# Patient Record
Sex: Female | Born: 1935 | Race: White | Hispanic: No | Smoking: Never smoker
Health system: Southern US, Community
[De-identification: ages and names within clinical notes are randomized; demographics above are authoritative.]

## PROBLEM LIST (undated history)

## (undated) DIAGNOSIS — I10 Essential (primary) hypertension: Secondary | ICD-10-CM

## (undated) DIAGNOSIS — M1990 Unspecified osteoarthritis, unspecified site: Secondary | ICD-10-CM

## (undated) HISTORY — PX: KNEE SURGERY: SHX244

## (undated) HISTORY — PX: APPENDECTOMY: SHX54

---

## 2005-12-23 ENCOUNTER — Emergency Department (HOSPITAL_COMMUNITY): Admission: EM | Admit: 2005-12-23 | Discharge: 2005-12-23 | Payer: Self-pay | Admitting: Emergency Medicine

## 2005-12-24 ENCOUNTER — Ambulatory Visit: Payer: Self-pay | Admitting: Orthopedic Surgery

## 2005-12-25 ENCOUNTER — Ambulatory Visit: Payer: Self-pay | Admitting: Orthopedic Surgery

## 2005-12-25 ENCOUNTER — Inpatient Hospital Stay (HOSPITAL_COMMUNITY): Admission: RE | Admit: 2005-12-25 | Discharge: 2005-12-29 | Payer: Self-pay | Admitting: Orthopedic Surgery

## 2005-12-29 ENCOUNTER — Inpatient Hospital Stay: Admission: AD | Admit: 2005-12-29 | Discharge: 2006-05-18 | Payer: Self-pay | Admitting: Family Medicine

## 2006-01-07 ENCOUNTER — Ambulatory Visit: Payer: Self-pay | Admitting: Orthopedic Surgery

## 2006-02-04 ENCOUNTER — Ambulatory Visit: Payer: Self-pay | Admitting: Orthopedic Surgery

## 2006-03-25 ENCOUNTER — Ambulatory Visit: Payer: Self-pay | Admitting: Orthopedic Surgery

## 2009-05-05 ENCOUNTER — Emergency Department (HOSPITAL_COMMUNITY): Admission: EM | Admit: 2009-05-05 | Discharge: 2009-05-05 | Payer: Self-pay | Admitting: Emergency Medicine

## 2010-06-04 ENCOUNTER — Inpatient Hospital Stay (HOSPITAL_COMMUNITY): Admission: EM | Admit: 2010-06-04 | Discharge: 2010-06-04 | Payer: Self-pay | Admitting: Emergency Medicine

## 2010-11-13 LAB — DIFFERENTIAL
Basophils Absolute: 0 10*3/uL (ref 0.0–0.1)
Basophils Relative: 0 % (ref 0–1)
Eosinophils Absolute: 0 10*3/uL (ref 0.0–0.7)
Eosinophils Relative: 0 % (ref 0–5)
Lymphocytes Relative: 9 % — ABNORMAL LOW (ref 12–46)
Lymphs Abs: 0.8 10*3/uL (ref 0.7–4.0)
Monocytes Absolute: 0.3 10*3/uL (ref 0.1–1.0)
Monocytes Relative: 4 % (ref 3–12)
Neutro Abs: 7.4 10*3/uL (ref 1.7–7.7)
Neutrophils Relative %: 87 % — ABNORMAL HIGH (ref 43–77)

## 2010-11-13 LAB — CBC
HCT: 36.3 % (ref 36.0–46.0)
Hemoglobin: 12.3 g/dL (ref 12.0–15.0)
MCH: 30.5 pg (ref 26.0–34.0)
MCHC: 34 g/dL (ref 30.0–36.0)
MCV: 89.7 fL (ref 78.0–100.0)
Platelets: 157 10*3/uL (ref 150–400)
RBC: 4.05 MIL/uL (ref 3.87–5.11)
RDW: 14.1 % (ref 11.5–15.5)
WBC: 8.6 10*3/uL (ref 4.0–10.5)

## 2010-11-13 LAB — BASIC METABOLIC PANEL
BUN: 17 mg/dL (ref 6–23)
CO2: 28 mEq/L (ref 19–32)
Calcium: 8.9 mg/dL (ref 8.4–10.5)
Chloride: 105 mEq/L (ref 96–112)
Creatinine, Ser: 1.12 mg/dL (ref 0.4–1.2)
GFR calc Af Amer: 55 mL/min — ABNORMAL LOW (ref >60)
GFR calc non Af Amer: 45 mL/min — ABNORMAL LOW (ref >60)
Glucose, Bld: 173 mg/dL — ABNORMAL HIGH (ref 70–99)
Potassium: 3.8 mEq/L (ref 3.5–5.1)
Sodium: 140 mEq/L (ref 135–145)

## 2010-11-13 LAB — POCT CARDIAC MARKERS
CKMB, poc: 1.4 ng/mL (ref 1.0–8.0)
Myoglobin, poc: 278 ng/mL (ref 12–200)
Troponin i, poc: <0.05 ng/mL (ref 0.00–0.09)

## 2010-12-05 LAB — BASIC METABOLIC PANEL
BUN: 12 mg/dL (ref 6–23)
CO2: 27 mEq/L (ref 19–32)
Calcium: 9.4 mg/dL (ref 8.4–10.5)
Chloride: 101 mEq/L (ref 96–112)
Creatinine, Ser: 0.96 mg/dL (ref 0.4–1.2)
GFR calc Af Amer: >60 mL/min (ref >60)
GFR calc non Af Amer: 54 mL/min — ABNORMAL LOW (ref >60)
Glucose, Bld: 110 mg/dL — ABNORMAL HIGH (ref 70–99)
Potassium: 3.8 mEq/L (ref 3.5–5.1)
Sodium: 138 mEq/L (ref 135–145)

## 2010-12-05 LAB — DIFFERENTIAL
Basophils Absolute: 0 10*3/uL (ref 0.0–0.1)
Basophils Relative: 0 % (ref 0–1)
Eosinophils Absolute: 0 10*3/uL (ref 0.0–0.7)
Eosinophils Relative: 0 % (ref 0–5)
Lymphocytes Relative: 15 % (ref 12–46)
Lymphs Abs: 0.7 10*3/uL (ref 0.7–4.0)
Monocytes Absolute: 0.3 10*3/uL (ref 0.1–1.0)
Monocytes Relative: 6 % (ref 3–12)
Neutro Abs: 4.1 10*3/uL (ref 1.7–7.7)
Neutrophils Relative %: 79 % — ABNORMAL HIGH (ref 43–77)

## 2010-12-05 LAB — CBC
HCT: 39.4 % (ref 36.0–46.0)
Hemoglobin: 13.4 g/dL (ref 12.0–15.0)
MCHC: 34.1 g/dL (ref 30.0–36.0)
MCV: 89.8 fL (ref 78.0–100.0)
Platelets: 147 10*3/uL — ABNORMAL LOW (ref 150–400)
RBC: 4.39 MIL/uL (ref 3.87–5.11)
RDW: 13.3 % (ref 11.5–15.5)
WBC: 5.1 10*3/uL (ref 4.0–10.5)

## 2010-12-05 LAB — POCT CARDIAC MARKERS
CKMB, poc: <1 ng/mL — ABNORMAL LOW (ref 1.0–8.0)
Myoglobin, poc: 112 ng/mL (ref 12–200)
Troponin i, poc: <0.05 ng/mL (ref 0.00–0.09)

## 2011-01-16 NOTE — Group Therapy Note (Signed)
[name], [name]                  ACCOUNT NO.:  [id number]   MEDICAL RECORD NO.:  [id number]          PATIENT TYPE:  ORB   LOCATION:  S109                          FACILITY:  [facility]   PHYSICIAN:  Angus G. Renard Matter, MD   DATE OF BIRTH:  22-Aug-1913   DATE OF PROCEDURE:  DATE OF DISCHARGE:                                   PROGRESS NOTE   This patient was admitted to the nursing center in May 2007.  She had an  open internal fixation of her left tibia plateau and bone graft.  She is  being seen by Dr. Fuller Canada. Her condition remains stable.  She is  semi-ambulatory.   OBJECTIVE:  VITAL SIGNS:  Blood pressure 157/94, respirations 20,  temperature 98.  LUNGS:  Clear to P&A.  HEART:  Regular rhythm.  ABDOMEN:  No palpable organs or masses.   IMPRESSION:  Patient is continuing to improve following surgery.   PLAN:  To continue current regimen.  Continue to participate in PT and OT.  Medication list reviewed, continue medications.      Angus G. Renard Matter, MD  Electronically Signed     AGM/MEDQ  D:  04/11/2006  T:  04/12/2006  Job:  [id number]

## 2011-01-16 NOTE — Op Note (Signed)
[name], [name]                  ACCOUNT NO.:  [id number]   MEDICAL RECORD NO.:  [id number]          PATIENT TYPE:  INP   LOCATION:  A312                          FACILITY:  [facility]   PHYSICIAN:  Vickki Hearing, M.D.DATE OF BIRTH:  1912/10/03   DATE OF PROCEDURE:  12/25/2005  DATE OF DISCHARGE:                                 OPERATIVE REPORT   BRIEF HISTORY:  This is a 77 year old female.  She fell coming out of the  house, landed on her left knee on April 25, was seen in the ER that day,  placed in an immobilizer and sent to the office April 26.  She had  radiographs taken in the office which confirmed a lateral split and  depressed left tibial plateau fracture and she was scheduled for surgery for  today.   PREOPERATIVE DIAGNOSIS:  Closed left tibial plateau fracture (lateral split  depression).   POSTOP DIAGNOSIS:  Closed left tibial plateau fracture (lateral split  depression).   PROCEDURE:  Open treatment internal fixation with allograft bone chips.   TOURNIQUET TIME:  Hour and 30 minutes.   We used spinal anesthetic, had minimal blood loss, used a Synthes  periarticular plate with locking screws.   SURGEON:  Romeo Apple assisted by Forde Dandy.   OPERATIVE FINDINGS:  Lateral split depression with intra-articular extension  and displacement.   The procedure was done as follows.  Ms. Cummings was identified in preop  holding area as [place].  She marked her left knee as the surgical  site.  This was countersigned by the surgeon and then we updated her history  and physical.  She was given a gram of Ancef, taken to the operating room  for a spinal anesthetic.  She was placed supine on the operating table and  her leg was prepped and draped with DuraPrep in sterile technique.  The time-  out was completed.  The procedure was confirmed as a left tibial plateau  fracture, open treatment internal fixation.  We confirmed antibiotics given  within an hour and that the  patient was [place] H[place].   We elevated the tourniquet to 300 mmHg after exsanguination of the limb with  a 4-inch Esmarch.  We then made a curvilinear incision over the lateral  plateau, extended it down to the fascia in one continuous split, divided the  fascia in line with the skin incision and then opened the capsule and lifted  the lateral meniscus superiorly and identified the fracture.  We hinged open  the fracture site, elevated the fracture and placed bone graft, closed the  fracture back down, held it with K-wires and applied a lateral buttress type  plate using a standard technique of drilling, depth gauge and insertion of  screws.  I took radiographs to confirm the position of our reduction  throughout the procedure and found it to be satisfactory.  We irrigated the  wound, closed in layered fashion with 0 Monocryl, 2-0 Monocryl and staples.  We injected 30 mL Sensorcaine.  At the end of the procedure we placed a  sterile  dressing and Ace bandage, a cryo cuff and a hinged knee brace.   POSTOP PLAN:  The patient will be nonweightbearing until radiographs suggest  weightbearing can be started.  We suspect this will take 3 months.  She will  be in a CPM machine advanced up to 0 to 90.  She will stay in a brace for 3  months.      Vickki Hearing, M.D.  Electronically Signed     SEH/MEDQ  D:  12/25/2005  T:  12/26/2005  Job:  [id number]

## 2011-01-16 NOTE — H&P (Signed)
[name], [name]                  ACCOUNT NO.:  [id number]   MEDICAL RECORD NO.:  [id number]          PATIENT TYPE:  AMB   LOCATION:  DAY                           FACILITY:  [facility]   PHYSICIAN:  Vickki Hearing, M.D.DATE OF BIRTH:  December 25, 1912   DATE OF ADMISSION:  12/24/2005  DATE OF DISCHARGE:  LH                                HISTORY & PHYSICAL   CHIEF COMPLAINT:  Surgery, left knee, scheduled for December 25, 2005.   HISTORY OF PRESENT ILLNESS:  This is a 77 year old female who fell on December 23, 2005 and fractured her left tibial plateau.  She was seen in the office  on December 24, 2005.  A 10-degree caudal tilt view of the knee was taken which  showed the displaced fracture not seen on x-rays at the hospital.  She  complains of mild pain at this time.  She denies any numbness or tingling.  She does have swelling   This patient is remarkably healthy.   REVIEW OF SYSTEMS:  She has a negative review of systems.   ALLERGIES:  She has no allergies.   PAST MEDICAL HISTORY:  She does have hypertension.   PAST SURGICAL HISTORY:  She is status post an appendectomy.   MEDICATIONS:  She takes Norvasc 5 mg a day.   FAMILY HISTORY:  Heart disease and diabetes.   FAMILY PHYSICIAN:  Dr. Renard Matter.   SOCIAL HISTORY:  She is widowed.  she has no social habits and has her high  school diploma.   PHYSICAL EXAMINATION:  VITAL SIGNS:  Her weight is 98 pounds.  Her pulse is  86 and her respiratory rate is 18.  HEENT:  Normal head, eyes, ears, nose and throat.  NECK:  Supple neck with no lymphadenopathy.  CHEST:  Clear.  HEART:  Rate and rhythm normal.  ABDOMEN:  Soft and nondistended.  No tenderness.  EXTREMITIES:  Her injured extremity is swollen.  She has painful range of  motion.  She has leg length and alignments, which are intact.  The ligaments  of the knee could not be tested due to pain.  Muscle strength continues to  be normal.   ADMITTING DIAGNOSIS:  Closed tibial plateau  fracture of the left lower  extremity.   PLAN:  The plan is for open treatment/internal fixation with plate and screw  construct plus-or-minus bone graft.      Vickki Hearing, M.D.  Electronically Signed     SEH/MEDQ  D:  12/24/2005  T:  12/25/2005  Job:  [id number]   cc:   Angus G. Renard Matter, MD  Fax: [phone]

## 2011-01-16 NOTE — Group Therapy Note (Signed)
[name], [name]                  ACCOUNT NO.:  [id number]   MEDICAL RECORD NO.:  [id number]          PATIENT TYPE:  ORB   LOCATION:  S109                          FACILITY:  [facility]   PHYSICIAN:  Angus G. Renard Matter, MD   DATE OF BIRTH:  1913-04-10   DATE OF PROCEDURE:  12/31/2005  DATE OF DISCHARGE:                                   PROGRESS NOTE   This patient was admitted to the nursing center on Dec 29, 2005, after having  been treated by Vickki Hearing, M.D. and apparently the patient had  open internal fixation of the left tibial plateau with bone graft.  She  remains on Lovenox.   PHYSICAL EXAMINATION:  VITAL SIGNS:  Blood pressure 104/69, respirations 18,  pulse 97.  LUNGS:  Clear.  HEART:  Regular rhythm.  ABDOMEN:  No palpable organs or masses.  The patient has some tenderness  over the incision of the left knee.   ASSESSMENT:  The patient has a history of fracture of tibial plateau, left  knee.  She had surgery on December 25, 2005.  Plan to continue current orders.  The patient is to have nonweightbearing for 12 weeks.      Angus G. Renard Matter, MD  Electronically Signed     AGM/MEDQ  D:  12/31/2005  T:  01/01/2006  Job:  [id number]

## 2011-01-16 NOTE — Discharge Summary (Signed)
[name], [name]                  ACCOUNT NO.:  [id number]   MEDICAL RECORD NO.:  [id number]          PATIENT TYPE:  INP   LOCATION:  A312                          FACILITY:  [facility]   PHYSICIAN:  Vickki Hearing, M.D.DATE OF BIRTH:  [birth year]   DATE OF ADMISSION:  12/25/2005  DATE OF DISCHARGE:  05/01/2007LH                                 DISCHARGE SUMMARY   ADMITTING DIAGNOSIS:  Closed split depressed left tibial plateau fracture.   POSTOPERATIVE DIAGNOSIS:  Closed split depressed left tibial plateau  fracture.   OPERATIVE PROCEDURE:  Open treatment internal fixation, left tibial plateau  with bone graft.   HISTORY:  This is a 77 year old female who fell April 25, fractured her left  tibial plateau.  Radiographs in my office showed displacement of a fracture,  not seen as displaced at the hospital.   Her hospital course was remarkable for developing some nausea and vomiting  on postoperative day 2 resolved with Phenergan.   LAB STUDIES:  Hemoglobin 10.5 on discharge on April 29.  Potassium 3.8.  Her  urine was clear on the 26th.   MEDICATIONS:  1.  Tylenol 1000 mg every 8 hours for pain.  2.  Norvasc 5 mg daily.  3.  Lovenox 30 mg daily.  4.  K-Dur 20 mEq daily.  5.  Phenergan 25 mg p.o. q.6 p.r.n. nausea.   PHYSICAL THERAPY INSTRUCTIONS:  The patient is nonweightbearing for a total  of 12 weeks.   She is to have passive range of motion to tolerance and strengthening  exercises for the left lower extremity.  Gait training.   WOUND INSTRUCTIONS:  Cherlynn Polo are to be removed on May 8.   FOLLOW-UP VISIT:  May 9.  Call [phone] for appointment.   DISPOSITION:  To [facility].   CONDITION:  Overall improved.      Vickki Hearing, M.D.  Electronically Signed     SEH/MEDQ  D:  12/29/2005  T:  12/29/2005  Job:  [id number]

## 2011-01-16 NOTE — Group Therapy Note (Signed)
[name], [name]                  ACCOUNT NO.:  [id number]   MEDICAL RECORD NO.:  [id number]          PATIENT TYPE:  INP   LOCATION:  A312                          FACILITY:  [facility]   PHYSICIAN:  Vickki Hearing, M.D.DATE OF BIRTH:  04-17-1913   DATE OF PROCEDURE:  12/26/2005  DATE OF DISCHARGE:                                   PROGRESS NOTE   She is 1 day status post open treatment and internal fixation with bone  graft of her left tibial plateau fracture.  Vital signs are stable.  Pain  level is between 4 and 6 on Vicodin and morphine p.r.n. every 2 hours.  No  signs of compartment syndrome.  Neurovascular status to the limb is normal.  I did add some potassium today for potassium at 3.6 to 3.5 range.  We will  do that once a day until she is discharged.  She will start bed-to-chair  mobilization, nonweightbearing today.      Vickki Hearing, M.D.  Electronically Signed     SEH/MEDQ  D:  12/26/2005  T:  12/27/2005  Job:  [id number]

## 2011-01-16 NOTE — Group Therapy Note (Signed)
[name], [name]                  ACCOUNT NO.:  [id number]   MEDICAL RECORD NO.:  [id number]          PATIENT TYPE:  ORB   LOCATION:  S109                          FACILITY:  [facility]   PHYSICIAN:  Angus G. Renard Matter, MD   DATE OF BIRTH:  03-07-1913   DATE OF PROCEDURE:  DATE OF DISCHARGE:                                   PROGRESS NOTE   This patient was originally admitted to the Nursing Center in May, 2007.  She did have an open internal fixation of the left tibial plateau and bone  graft.  She is being treated by Dr. Fuller Canada.  Her condition remains  stable.   OBJECTIVE:  VITAL SIGNS:  Blood pressure 123/70, respirations 18, pulse 84,  temperature 97.  LUNGS:  Clear.  HEART:  Regular rhythm.   ASSESSMENT:  The patient has a history of fractured tibial plateau, left  knee surgery in April of 2007.   PLAN:  1.  To continue current regimen.  2.  Medications reviewed.  Continue current regimen.      Angus G. Renard Matter, MD  Electronically Signed     AGM/MEDQ  D:  03/07/2006  T:  03/07/2006  Job:  [id number]

## 2011-01-16 NOTE — Group Therapy Note (Signed)
[name], [name]                  ACCOUNT NO.:  [id number]   MEDICAL RECORD NO.:  [id number]          PATIENT TYPE:  ORB   LOCATION:  S109                          FACILITY:  [facility]   PHYSICIAN:  Angus G. Renard Matter, MD   DATE OF BIRTH:  December 05, 1912   DATE OF PROCEDURE:  DATE OF DISCHARGE:                                   PROGRESS NOTE   This patient was originally admitted to the nursing center in May, 2007.  At  that time, being treated by Dr. Fuller Canada. She had an opened internal  fixation of the left tibia plateau with bone graft.  Her condition remains  stable.   PHYSICAL EXAMINATION:  GENERAL:  She was alert and oriented.  VITAL SIGNS:  Her blood pressure 100/63, respirations 18, pulse 10,  temperature 97.7.  LUNGS:  Clear.  HEART:  Regular rhythm.  ABDOMEN:  No palpable organs or masses.   ASSESSMENT:  The patient has a history of fractured tibial plateau, left  knee with surgery December 25, 2005.   PLAN:  Continue current regimen.   MEDICATIONS REVIEWED:  Continue current medicines.   She continues to have an air cast to left leg and is nonweightbearing for a  total of 12 weeks.      Angus G. Renard Matter, MD  Electronically Signed     AGM/MEDQ  D:  02/15/2006  T:  02/16/2006  Job:  [id number]

## 2011-01-16 NOTE — Procedures (Signed)
[name], [name]                  ACCOUNT NO.:  [id number]   MEDICAL RECORD NO.:  [id number]          PATIENT TYPE:  INP   LOCATION:  A312                          FACILITY:  [facility]   PHYSICIAN:  Edward L. Juanetta Gosling, M.D.DATE OF BIRTH:  22-Apr-1913   DATE OF PROCEDURE:  DATE OF DISCHARGE:                                EKG INTERPRETATION   Rhythm is sinus rhythm with a rate in the 90s.  There is right atrial  enlargement and probably left atrial enlargement as well.  There is left  ventricular hypertrophy.  There are Q-waves in the initial precordial leads  which could be due to a previous infarction or could be related to left  ventricular hypertrophy.  Abnormal electrocardiogram.      Oneal Deputy. Juanetta Gosling, M.D.  Electronically Signed     ELH/MEDQ  D:  12/25/2005  T:  12/25/2005  Job:  [id number]

## 2011-01-16 NOTE — Group Therapy Note (Signed)
NAMEANGELICIA, LESSNER                  ACCOUNT NO.:  [id number]   MEDICAL RECORD NO.:  [id number]          PATIENT TYPE:  ORB   LOCATION:  S109                          FACILITY:  [facility]   PHYSICIAN:  Angus G. Renard Matter, MD   DATE OF BIRTH:  [birth year]   DATE OF PROCEDURE:  DATE OF DISCHARGE:                                   PROGRESS NOTE   This patient has been in the nursing center since May, 2007.  She had open  internal fixation of her left tibial plateau and bone graft.  She is being  followed as well by Dr. Fuller Canada.  Her condition remains stable.  She is semi ambulatory now.   OBJECTIVE:  VITAL SIGNS:  Blood pressure 140/78, respirations 20, pulse 83,  temp 98.  LUNGS:  Clear to P&A.  HEART:  Regular rhythm.  ABDOMEN:  No palpable organs or masses.   IMPRESSION:  Patient continues to improve following surgery, as stated  above.   PLAN:  Continue current regimen.  Continue to participate in PT/OT.  Medication list reviewed.  Continue current medications.      Angus G. Renard Matter, MD  Electronically Signed     AGM/MEDQ  D:  05/13/2006  T:  05/14/2006  Job:  [id number]

## 2011-11-30 ENCOUNTER — Other Ambulatory Visit: Payer: Self-pay

## 2011-11-30 ENCOUNTER — Emergency Department (HOSPITAL_COMMUNITY): Payer: Medicare PPO

## 2011-11-30 ENCOUNTER — Inpatient Hospital Stay (HOSPITAL_COMMUNITY)
Admission: EM | Admit: 2011-11-30 | Discharge: 2011-12-04 | DRG: 923 | Disposition: A | Discharge status: Skilled Nursing Facility | Payer: Medicare PPO | Attending: Family Medicine | Admitting: Family Medicine

## 2011-11-30 ENCOUNTER — Encounter (HOSPITAL_COMMUNITY): Payer: Self-pay | Admitting: *Deleted

## 2011-11-30 DIAGNOSIS — M1990 Unspecified osteoarthritis, unspecified site: Secondary | ICD-10-CM | POA: Diagnosis present

## 2011-11-30 DIAGNOSIS — I10 Essential (primary) hypertension: Secondary | ICD-10-CM | POA: Diagnosis present

## 2011-11-30 DIAGNOSIS — F29 Unspecified psychosis not due to a substance or known physiological condition: Secondary | ICD-10-CM | POA: Diagnosis present

## 2011-11-30 DIAGNOSIS — R4182 Altered mental status, unspecified: Secondary | ICD-10-CM

## 2011-11-30 DIAGNOSIS — W19XXXA Unspecified fall, initial encounter: Secondary | ICD-10-CM

## 2011-11-30 DIAGNOSIS — Z9181 History of falling: Secondary | ICD-10-CM

## 2011-11-30 DIAGNOSIS — T68XXXA Hypothermia, initial encounter: Principal | ICD-10-CM | POA: Diagnosis present

## 2011-11-30 DIAGNOSIS — E860 Dehydration: Secondary | ICD-10-CM | POA: Diagnosis present

## 2011-11-30 DIAGNOSIS — I2510 Atherosclerotic heart disease of native coronary artery without angina pectoris: Secondary | ICD-10-CM | POA: Diagnosis present

## 2011-11-30 DIAGNOSIS — IMO0002 Reserved for concepts with insufficient information to code with codable children: Secondary | ICD-10-CM | POA: Diagnosis present

## 2011-11-30 DIAGNOSIS — X31XXXA Exposure to excessive natural cold, initial encounter: Secondary | ICD-10-CM | POA: Diagnosis present

## 2011-11-30 DIAGNOSIS — Y92009 Unspecified place in unspecified non-institutional (private) residence as the place of occurrence of the external cause: Secondary | ICD-10-CM

## 2011-11-30 DIAGNOSIS — I6789 Other cerebrovascular disease: Secondary | ICD-10-CM | POA: Diagnosis present

## 2011-11-30 HISTORY — DX: Unspecified osteoarthritis, unspecified site: M19.90

## 2011-11-30 HISTORY — DX: Essential (primary) hypertension: I10

## 2011-11-30 LAB — COMPREHENSIVE METABOLIC PANEL
ALT: 14 U/L (ref 0–35)
AST: 26 U/L (ref 0–37)
Albumin: 2.5 g/dL — ABNORMAL LOW (ref 3.5–5.2)
BUN: 29 mg/dL — ABNORMAL HIGH (ref 6–23)
CO2: 28 mEq/L (ref 19–32)
Calcium: 9.2 mg/dL (ref 8.4–10.5)
Chloride: 103 mEq/L (ref 96–112)
Creatinine, Ser: 1.1 mg/dL (ref 0.50–1.10)
GFR calc Af Amer: 48 mL/min — ABNORMAL LOW (ref >90)
GFR calc non Af Amer: 41 mL/min — ABNORMAL LOW (ref >90)
Glucose, Bld: 88 mg/dL (ref 70–99)
Sodium: 139 mEq/L (ref 135–145)
Total Bilirubin: 0.2 mg/dL — ABNORMAL LOW (ref 0.3–1.2)

## 2011-11-30 LAB — URINALYSIS, ROUTINE W REFLEX MICROSCOPIC
Bilirubin Urine: NEGATIVE
Glucose, UA: NEGATIVE mg/dL
Hgb urine dipstick: NEGATIVE
Leukocytes, UA: NEGATIVE
Nitrite: NEGATIVE
Protein, ur: NEGATIVE mg/dL
Specific Gravity, Urine: >1.03 — ABNORMAL HIGH (ref 1.005–1.030)
Urobilinogen, UA: 0.2 mg/dL (ref 0.0–1.0)

## 2011-11-30 LAB — DIFFERENTIAL
Basophils Absolute: 0 10*3/uL (ref 0.0–0.1)
Basophils Relative: 0 % (ref 0–1)
Eosinophils Absolute: 0 10*3/uL (ref 0.0–0.7)
Lymphocytes Relative: 14 % (ref 12–46)
Lymphs Abs: 0.6 10*3/uL — ABNORMAL LOW (ref 0.7–4.0)
Neutro Abs: 3.4 10*3/uL (ref 1.7–7.7)

## 2011-11-30 LAB — CBC
HCT: 35.4 % — ABNORMAL LOW (ref 36.0–46.0)
MCH: 28.5 pg (ref 26.0–34.0)
MCHC: 32.5 g/dL (ref 30.0–36.0)
MCV: 87.8 fL (ref 78.0–100.0)
Platelets: 127 10*3/uL — ABNORMAL LOW (ref 150–400)
RDW: 14.4 % (ref 11.5–15.5)
WBC: 4.3 10*3/uL (ref 4.0–10.5)

## 2011-11-30 LAB — CK TOTAL AND CKMB (NOT AT ARMC)
CK, MB: 7.2 ng/mL (ref 0.3–4.0)
Relative Index: 4 — ABNORMAL HIGH (ref 0.0–2.5)

## 2011-11-30 MED ORDER — SODIUM CHLORIDE 0.9 % IV BOLUS (SEPSIS)
500.0000 mL | Freq: Once | INTRAVENOUS | Status: AC
  Administered 2011-11-30: 1000 mL via INTRAVENOUS

## 2011-11-30 MED ORDER — SODIUM CHLORIDE 0.9 % IV SOLN
INTRAVENOUS | Status: DC
  Administered 2011-12-01 (×2): via INTRAVENOUS
  Administered 2011-12-02: 1000 mL via INTRAVENOUS
  Administered 2011-12-02 – 2011-12-03 (×3): via INTRAVENOUS

## 2011-11-30 MED ORDER — SODIUM CHLORIDE 0.9 % IV SOLN: INTRAVENOUS | Status: DC

## 2011-11-30 NOTE — ED Notes (Signed)
Lab called with critical ckmb level of 7.2, Dr. Ignacia Palma notified,

## 2011-11-30 NOTE — ED Provider Notes (Signed)
History     CSN: [id number]  Arrival date & time 11/30/11  1304   First MD Initiated Contact with Patient 11/30/11 1332      Chief Complaint  Patient presents with  . Fall  . Altered Mental Status    (Consider location/radiation/quality/duration/timing/severity/associated sxs/prior treatment) HPI Comments: The patient is a 78 year old woman who lives at home. She has caregivers that come in to help her. This morning her caregiver came in and found her on the floor in the kitchen. It's not clear how long she has been lying on the floor. She is confused. She was therefore brought to [facility] ED for evaluation.  The patient has altered mental status, and is unable to provide any history or review of systems.  Patient is a 78 y.o. female presenting with fall and altered mental status. The history is provided by a relative and medical records. No language interpreter was used.  Fall Incident onset: an unknown period ago. Incident: at home. Distance fallen: apparently fell from a standing position. Pain scale: not in pain at present. She was not ambulatory at the scene. There was no entrapment after the fall. There was no drug use involved in the accident. There was no alcohol use involved in the accident. Associated symptoms comments: Altered mental status.. Treatments tried: patient was transported to [facility] ED by EMS. The treatment provided no relief.  Altered Mental Status    Past Medical History  Diagnosis Date  . Hypertension   . Arthritis     Past Surgical History  Procedure Date  . Knee surgery     left knee    History reviewed. No pertinent family history.  History  Substance Use Topics  . Smoking status: Unknown If Ever Smoked  . Smokeless tobacco: Not on file  . Alcohol Use: No    OB History    Grav Para Term Preterm Abortions TAB SAB Ect Mult Living                  Review of Systems  Unable to perform ROS: Mental status change    Psychiatric/Behavioral: Positive for altered mental status.    Allergies  Aspirin  Home Medications  No current outpatient prescriptions on file.  BP 141/68  Pulse 66  Temp(Src) 92.2 F (33.4 C) (Rectal)  Resp 20  SpO2 100%  Physical Exam  Nursing note and vitals reviewed. Constitutional:       Patient's temperature was approximately 92. She is a frail elderly woman who is able answer some questions, but does not know what happened to her and is oriented to person only. She does not recognize her daughter-in-law.  HENT:  Head: Normocephalic and atraumatic.  Right Ear: External ear normal.  Left Ear: External ear normal.  Mouth/Throat: Oropharynx is clear and moist.  Eyes: Conjunctivae and EOM are normal. Pupils are equal, round, and reactive to light.  Neck: Normal range of motion. Neck supple.  Cardiovascular: Normal rate, regular rhythm and normal heart sounds.   Pulmonary/Chest: Effort normal and breath sounds normal.  Abdominal: Soft. Bowel sounds are normal.  Musculoskeletal:       Scar on left knee from prior surgery for a tibial plateau fracture.  Neurological:       Patient is awake, oriented to person only. There is no sensory or motor deficit.  Skin: Skin is warm and dry.       Her skin turgor is poor.  Psychiatric:  She is confused, and seems demented.    ED Course  Procedures (including critical care time)   1:48 PM Patient was seen and had physical examination. Old charts were reviewed. Lab tests and x-rays for per ordered. I advised the patient's daughter-in-law that the patient would likely need admission.   3:04 PM  Date: 11/30/2011  Rate:66  Rhythm: normal sinus rhythm  QRS Axis: left  Intervals: PR prolonged QRS:  Poor R wave progression in precordial leads suggests old anterior myocardial infarction.  ST/T Wave abnormalities: normal  Conduction Disutrbances:none  Narrative Interpretation: Abnormal EKG.  Old EKG Reviewed:  unchanged  3:49 PM Lab tests did not show any major abnormality.  She is receiving IV fluids and is being rewarmed.  Call To Dr. Renard Matter or his coverage to admit her.  1. Fall   2. Altered mental status           Carleene Cooper III, MD 11/30/11 [phone]

## 2011-11-30 NOTE — ED Notes (Signed)
MD at bedside. 

## 2011-11-30 NOTE — H&P (Signed)
NAMEALIEA, BOBE                  ACCOUNT NO.:  [id number]  MEDICAL RECORD NO.:  [id number]  LOCATION:  A313                          FACILITY:  [facility]  PHYSICIAN:  Mila Homer. Sudie Bailey, M.D.DATE OF BIRTH:  [birth year]  DATE OF ADMISSION:  11/30/2011 DATE OF DISCHARGE:  LH                             HISTORY & PHYSICAL   This 78 year old was found on the floor of her home in the kitchen this morning around 9:00 am.  She had been last seen at around 1:00 p.m. 2 days ago.  She was confused.  She was brought to the [facility] emergency room for evaluation.  She is living by herself.  Her son and daughter-in-law gave me some of the history.  She has generally been pretty healthy, but required some surgery on the left knee about 6 years ago after she fell off a porch. She has had chronic swelling in the left leg since then.  She has been on amlodipine for blood pressure.  The last she saw her LMD, Dr. Renard Matter, was about a year and a half ago.  She also has a history of emphysema and arthritis as well as hypertension.  According to her daughter-in-law, she was walking with difficulty and sometimes with a walker at home before all this happened.  She has had falls in the past.  At present, she is complaining of no pain.  She is talking about events which her daughter-in-law said happened 30 years ago.  Her speech appears to be normal although she is confused.  On admission to the emergency room, her blood pressure is 141/68, the pulse is 66 and temperature 92.2 Fahrenheit rectally and her respiratory rate is 20.  O2 saturation was 100%.  At the time I saw her, her temperature had warmed up to about 95 degrees on a warming blanket.  She was supine in bed.  Her son and daughter-in-law were in attendance. Again, she was confused, but her speech appeared to be normal.  Her mucous membranes were dry.  Her heart had a regular rhythm.  Rate of about 90 without murmur.  Her  lungs appeared to be clear throughout, but there were decreased breath sounds.  Her abdomen was soft without organomegaly or mass or tenderness.  She had 1+ pitting edema of the distal left leg, ankle and foot, but not on the right side.  Her admission white cell count is 4300, hemoglobin 11.5.  There were 79% neutrophils, 14 lymphs.  The CMP showed a BUN of 29, creatinine 1.10. Her CK was 178, CK-MB 7.2.  CT scan of the head showed atrophy and chronic microvascular ischemia, but nothing acute.  Chest x-ray showed chronic emphysema with calcification and  unfolding of the aorta, and scarring in the pleural and parenchymal areas at the apices.  A 12 lead EKG showed a sinus rhythm.  There was a first-degree AV block and what appeared to be a sign of an old anteroseptal MI, but no change in the EKG compared to October, 2011.  ADMISSION DIAGNOSES: 1. Hypothermia. 2. A fall in the elderly, question etiology. 3. Possible cerebrovascular accident. 4. Benign essential hypertension. 5. Coronary  artery disease based on EKG. 6. Generalized osteoarthritis. 7. Dehydration. I have discussed with the family.  The major issue right now may be hypothermia.  She has already hungry, wants something to eat and actually called her daughter-in-law by her correct name the first time today, so I thinks that she is improving.  Hopefully, once she warms up to normal body temperature and has a night's sleep, she will be more herself by tomorrow morning.  I discussed the possibility of short-term nursing home placement with her daughter-in-law, who is concerned about her going home by herself. They will talk to Dr. Renard Matter more about this in detail.  Tomorrow morning, we will recheck a CBC, BMP, with further imaging studies such as MRI of the brain if needed.  We will have a better idea of how she is mentally tomorrow.     Mila Homer. Sudie Bailey, M.D.     SDK/MEDQ  D:  11/30/2011  T:  11/30/2011   Job:  [id number]

## 2011-11-30 NOTE — ED Notes (Signed)
Pt family states that pt lives by herself, has home aide that comes in on mon/wed/friday, pt was last seen on Saturday at 1pm when her aide left. Pt was found this am by her aide lying in floor, confused, family report that tea was all over everything and chairs were turned over in the kitchen. Normally pt is alert, able to function by herself. Family also report that pt has been unsteady on her feet this am,

## 2011-11-30 NOTE — ED Notes (Signed)
CRITICAL VALUE ALERT  Critical value received:  ckmb 7.2  Date of notification:  11/30/2011  Time of notification:  16:05  Critical value read back: yes  Nurse who received alert:  Juliette Alcide, RN   MD notified (1st page):  Dr. Ignacia Palma   Time of first page:  16:05  MD notified (2nd page):  Time of second page:   Time MD responded:

## 2011-11-30 NOTE — ED Notes (Signed)
Pt arrived to er by [facility] due to fall that occurred yesterday, being unsteady on her feet,

## 2011-12-01 ENCOUNTER — Encounter (HOSPITAL_COMMUNITY): Payer: Self-pay

## 2011-12-01 ENCOUNTER — Inpatient Hospital Stay (HOSPITAL_COMMUNITY): Payer: Medicare PPO

## 2011-12-01 LAB — BASIC METABOLIC PANEL
CO2: 27 mEq/L (ref 19–32)
Calcium: 8.8 mg/dL (ref 8.4–10.5)
Creatinine, Ser: 0.99 mg/dL (ref 0.50–1.10)
Glucose, Bld: 76 mg/dL (ref 70–99)

## 2011-12-01 LAB — URINE CULTURE
Colony Count: NO GROWTH
Culture: NO GROWTH

## 2011-12-01 LAB — CBC
Hemoglobin: 11.5 g/dL — ABNORMAL LOW (ref 12.0–15.0)
MCH: 28.5 pg (ref 26.0–34.0)
MCV: 87.9 fL (ref 78.0–100.0)
Platelets: 151 10*3/uL (ref 150–400)
RBC: 4.04 MIL/uL (ref 3.87–5.11)

## 2011-12-01 MED ORDER — LORAZEPAM 2 MG/ML IJ SOLN
1.0000 mg | INTRAMUSCULAR | Status: DC | PRN
  Administered 2011-12-01: 1 mg via INTRAVENOUS
  Filled 2011-12-01: qty 1

## 2011-12-01 NOTE — Progress Notes (Signed)
Patient's family in room with patient and stated she is different today because she is not as verbal and has a different look in her eyes. Called Dr. Renard Matter this morning and he stated to order an MRI.

## 2011-12-01 NOTE — Progress Notes (Signed)
INITIAL ADULT NUTRITION ASSESSMENT Date: 12/01/2011   Time: 10:17 AM Reason for Assessment: Nutrition Risk Screen (Pt appears malnourished)  ASSESSMENT: Female 78 y.o.  Dx: s/p fall, Altered Mental Status, Hypothermia    Past Medical History  Diagnosis Date  . Hypertension   . Arthritis    Scheduled Meds:   . sodium chloride  500 mL Intravenous Once   Continuous Infusions:   . sodium chloride 100 mL/hr at 12/01/11 0757  . DISCONTD: sodium chloride     PRN Meds:.  Ht: 5\' 3"  (160 cm)  Wt: 97 lb 6.4 oz (44.18 kg)  Ideal Wt: 52.4 kg  % Ideal Wt: 84%  Usual Wt: unknown (pt unable to provide at this time)   Body mass index is 17.25 kg/(m^2). Underweight  Food/Nutrition Related Hx: Pt has been living alone PTA. She is unable to provide hx at this time. She's lying in bed awake with her head back and mouth open lower dentures are loose. Agree with nursing that pt appears malnourished however unable to dx at this time due to lack of nutrition hx.   CMP     Component Value Date/Time   NA 142 12/01/2011 0533   K 3.6 12/01/2011 0533   CL 107 12/01/2011 0533   CO2 27 12/01/2011 0533   GLUCOSE 76 12/01/2011 0533   BUN 19 12/01/2011 0533   CREATININE 0.99 12/01/2011 0533   CALCIUM 8.8 12/01/2011 0533   PROT 6.5 11/30/2011 1446   ALBUMIN 2.5* 11/30/2011 1446   AST 26 11/30/2011 1446   ALT 14 11/30/2011 1446   ALKPHOS 120* 11/30/2011 1446   BILITOT 0.2* 11/30/2011 1446   GFRNONAA 47* 12/01/2011 0533   GFRAA 54* 12/01/2011 0533   No intake or output data in the 24 hours ending 12/01/11 1020   Diet Order: Regular  Supplements/Tube Feeding:none at this time  IVF:    sodium chloride Last Rate: 100 mL/hr at 12/01/11 0757  DISCONTD: sodium chloride     Estimated Nutritional Needs:   Kcal:1320-1540 kcal per day (for gradual wt improvement) Protein:57-66 grams per day Fluid:1.3 Liters per day  NUTRITION DIAGNOSIS: -Inadequate oral intake (NI-2.1).  Status: Ongoing  RELATED TO: altered mental  status  AS EVIDENCE BY: RN reports pt very agitated, confused  MONITORING/EVALUATION(Goals): Monitor: po intake, labs and wt status  EDUCATION NEEDS: -Education not appropriate at this time  INTERVENTION: -Downgrade diet to [employer] -Assist with feeding -Add Magic Cup with lunch and dinner (290 kcal, 9 gr protein each)  Dietitian [phone]  DOCUMENTATION CODES Per approved criteria  -Underweight    Miracle Mongillo, Charlann Noss 12/01/2011, 10:17 AM

## 2011-12-01 NOTE — Progress Notes (Signed)
[name], [name]                  ACCOUNT NO.:  [id number]  MEDICAL RECORD NO.:  [id number]  LOCATION:  A313                          FACILITY:  [facility]  PHYSICIAN:  Laurynn Mccorvey G. Renard Matter, MD   DATE OF BIRTH:  26-Mar-1913  DATE OF PROCEDURE:  12/01/2011 DATE OF DISCHARGE:                                PROGRESS NOTE   SUBJECTIVE:  This patient was admitted with hypothermia.  She had a fall of questionable etiology.  She does have hypertension, coronary artery disease, osteoarthritis, and evidence of dehydration.  She remains somewhat confused.  CBC, BMP have been repeated.  CT of the head showed atrophy and chronic microvascular ischemia.  No acute intracranial abnormality.  OBJECTIVE:  VITAL SIGNS:  Blood pressure 125/78, respirations 20, pulse 94, temperature 98.3. HEART:  Regular rhythm. LUNGS:  Clear to P and A. ABDOMEN:  No palpable organs or masses.  ASSESSMENT:  The patient had a fall at home, was hypothermic with some question of cerebrovascular accident.  She was dehydrated.  Her temperature is relatively normal now.  PLAN:  To obtain Neurology consult and we will work on nursing home placement.     Anginette Espejo G. Renard Matter, MD     AGM/MEDQ  D:  12/01/2011  T:  12/01/2011  Job:  [id number]

## 2011-12-01 NOTE — Progress Notes (Signed)
   CARE MANAGEMENT NOTE 12/01/2011  Patient:  SOLINA, HERON A   Account Number:  [id number]  Date Initiated:  12/01/2011  Documentation initiated by:  Sharrie Rothman  Subjective/Objective Assessment:   Pt admitted with fall and hypothermia. Pt lives alone and pt son Dannielle Huh checks on pt frequently. Pt very confused and agitated at this time.     Action/Plan:   Pt family is interested in placement at this time. Will consult CSW.   Anticipated DC Date:  12/08/2011   Anticipated DC Plan:  SKILLED NURSING FACILITY  In-house referral  Clinical Social Worker      DC Planning Services  CM consult      Choice offered to / List presented to:             Status of service:  Completed, signed off Medicare Important Message given?   (If response is "NO", the following Medicare IM given date fields will be blank) Date Medicare IM given:   Date Additional Medicare IM given:    Discharge Disposition:  SKILLED NURSING FACILITY  Per UR Regulation:    If discussed at Long Length of Stay Meetings, dates discussed:    Comments:  12/01/11 1342 Arlyss Queen, RN BSN CM Pt admitted with fall and hypothermia. Pt son is requesting placement since pt lives alone and is unable to take care of self at this time. CSW is aware.

## 2011-12-01 NOTE — Progress Notes (Signed)
Report given to nurse.

## 2011-12-01 NOTE — Progress Notes (Signed)
Clinical Social Work Department BRIEF PSYCHOSOCIAL ASSESSMENT 12/01/2011  Patient:  [name], [name]     Account Number:  [id number]     Admit date:  11/30/2011  Clinical Social Worker:  Andres Shad  Date/Time:  12/01/2011 12:35 PM  Referred by:  Care Management  Date Referred:  12/01/2011 Referred for  SNF Placement   Other Referral:   Patient from home, Independent with new onset of AMS.  Family situation also to be assessed   Interview type:  Family Other interview type:   Spoke with care managment along with son and daughter in Social worker. Patient also has an aide in the home    PSYCHOSOCIAL DATA Living Status:  ALONE Admitted from facility:   Level of care:   Primary support name:  son Primary support relationship to patient:  FAMILY Degree of support available:   Very supportive.  Also has an aide who provides care throughout the week    CURRENT CONCERNS Current Concerns  Post-Acute Placement  Adjustment to Illness   Other Concerns:   LTC vs ST Care    SOCIAL WORK ASSESSMENT / PLAN Received referral for short term placement and family reporting they cannot take care of patient as she is currently requiring.  patient was living alone at home where she was independent with all ADLs, cooked and cleaned for herself.  She has an aide who periodically and weekly comes into the home to help provide care.    Patient son and daughter in law are at the bedside, expressing a lot of concern due to patient being found on the floor at home for an unknown amount of time.  patient is unable to verbalize and she is currently hallucinating and grabbing at items in the air.  She is calm and pleasant, agitated but not physically aggressive or trying to get up.    Family reports she will need short term placement due to AMS and for patient to get well.  Report she has been to [facility] in the past and would like her to return.  Explained referral process and will begin placement  process.   Assessment/plan status:  Psychosocial Support/Ongoing Assessment of Needs Other assessment/ plan:   Will complete FL2 and fax patient out with family permission in [facility]   Information/referral to community resources:   none at this time.    Will be giving bed offers once available.    PATIENT'S/FAMILY'S RESPONSE TO PLAN OF CARE: All are very cooperative and agreeable to SNF at dc.    Ashley Jacobs, MSW LCSW [phone]

## 2011-12-01 NOTE — Progress Notes (Addendum)
Clinical Social Work Department CLINICAL SOCIAL WORK PLACEMENT NOTE 12/01/2011  Patient:  [name], [name]  Account Number:  [id number] Admit date:  11/30/2011  Clinical Social Worker:  Ashley Jacobs, LCSW  Date/time:  12/01/2011 12:43 PM  Clinical Social Work is seeking post-discharge placement for this patient at the following level of care:   SKILLED NURSING   (*CSW will update this form in Epic as items are completed)   12/01/2011  Patient/family provided with [facility] Department of Clinical Social Work's list of facilities offering this level of care within the geographic area requested by the patient (or if unable, by the patient's family).  12/01/2011  Patient/family informed of their freedom to choose among providers that offer the needed level of care, that participate in Medicare, Medicaid or managed care program needed by the patient, have an available bed and are willing to accept the patient.  12/01/2011  Patient/family informed of [facility]' ownership interest in [facility], as well as of the fact that they are under no obligation to receive care at this facility.  PASARR submitted to EDS on 12/01/2011 PASARR number received from EDS on 12/02/11  FL2 transmitted to all facilities in geographic area requested by pt/family on  12/01/2011 FL2 transmitted to all facilities within larger geographic area on   Patient informed that his/her managed care company has contracts with or will negotiate with  certain facilities, including the following:     Patient/family informed of bed offers received:  Patient chooses bed at  [facility] Physician recommends and patient chooses bed at  [facility]  Patient to be transferred to  on  Anticipated for 4/5 [employer] authorization), may be 4/6 Patient to be transferred to facility by [facility] Staff  The following physician request were entered in Epic:   Additional Comments:  12/03/2011  Patient has a bed  at [facility] and CSW verified with Tammy at [facility].  Will discuss with family and will work with family and treatment team on dc plan.  Unclear where patient is medically.  Will continue to follow. HN  4/5 Patient has been dc and all information faxed over.  Patient has Administrator, sports and PT order and evaluation was obtained and faxed to [facility] who is working on authorization.  Patient 's dc packet and all dc information has been faxed.  Will send patient once authorization is obtained and completed.  No other needs at this time.  Patient will transfer by hospital staff.  Ashley Jacobs, MSW LCSW [phone]

## 2011-12-01 NOTE — Progress Notes (Signed)
Patient very agitated and confused since my taking over her care at 19:00 last evening.  She had to be put in safety mitts due to her trying to pull out her IV.  She has kept her arms up over her head and has been awake all night, not sleeping at all.  She mumbles, but does not make sense.  Her daughter-in-law called to check on her and stated that she lived by herself and a friend comes by and checks on her 3 times a week and they believe she needs some short-term rehab.  I put in a case management consult for further investigation.

## 2011-12-02 LAB — BASIC METABOLIC PANEL
BUN: 21 mg/dL (ref 6–23)
Calcium: 8.6 mg/dL (ref 8.4–10.5)
Creatinine, Ser: 0.92 mg/dL (ref 0.50–1.10)
GFR calc non Af Amer: 51 mL/min — ABNORMAL LOW (ref >90)
Glucose, Bld: 45 mg/dL — ABNORMAL LOW (ref 70–99)

## 2011-12-02 MED ORDER — LORAZEPAM 2 MG/ML IJ SOLN: 0.5000 mg | INTRAMUSCULAR | Status: DC | PRN

## 2011-12-02 NOTE — Progress Notes (Signed)
12/02/11 1751 Patient awake and more alert this evening with son and daughter-in-law at bedside. Stated she was able to tolerate vanilla ensure, drank one can. Patient alert to self, reoriented as needed, will monitor. Will notify nursing staff that patient prefers vanilla ensure supplements.

## 2011-12-02 NOTE — Progress Notes (Signed)
Patient has been very lethargic and sleepy since my taking over her care at 23:00 on 12/01/11.  She awoke at approximately 5:00 am, 12/02/11, and was confused about why she was in the hospital.  I cleaned her dentures and got those back in and she was asking lucid questions.  She does not remember falling.  The patient's temperature was found to be 93.5, although she was covered.  She was put back on the warming blanket and the temp set for 98.6.  Warm blankets were put on her as well.  She went back to sleep and is being watched.

## 2011-12-02 NOTE — Progress Notes (Signed)
UR Chart Review Completed  

## 2011-12-02 NOTE — Progress Notes (Signed)
NAMEROANN, MERK                  ACCOUNT NO.:  [id number]  MEDICAL RECORD NO.:  [id number]  LOCATION:  A313                          FACILITY:  [facility]  PHYSICIAN:  Promise Weldin G. Renard Matter, MD   DATE OF BIRTH:  [birth year]  DATE OF PROCEDURE:  12/02/2011 DATE OF DISCHARGE:                                PROGRESS NOTE   SUBJECTIVE:  This patient was admitted with hypothermia.  She had a fall of questionable etiology.  She does have hypertension, coronary artery disease, osteoarthritis, and evidence of dehydration.  She remains somewhat confused.  She did have an MRI done yesterday, which showed evidence of prominent small-vessel disease and global atrophy without hydrocephalus, paranasal sinus, and mucosal thickening.  She was agitated yesterday.  We did give her Ativan and she did sleep some following this.  OBJECTIVE:  VITAL SIGNS:  Blood pressure 165/74, respirations 20, pulse 69, temperature 93.6. HEART:  Regular rhythm. LUNGS:  Clear to P and A. ABDOMEN:  No palpable organs or masses. NEUROLOGIC:  No apparent motor or sensory weakness in extremities. Reflexes are equal.  ASSESSMENT:  The patient had a fall at home, was hypothermic on admission.  There was questionable cerebrovascular accident but she was not dehydrated.  Her temperature now is relatively normal.  She remains mentally confused.  PLAN:  To obtain Neurology consult.  We will work on nursing home placement.  Family does desire that this be accomplished.  Her temperature remains low, she is on a warming blanket.     Roland Lipke G. Renard Matter, MD     AGM/MEDQ  D:  12/02/2011  T:  12/02/2011  Job:  [id number]

## 2011-12-03 LAB — BASIC METABOLIC PANEL
BUN: 26 mg/dL — ABNORMAL HIGH (ref 6–23)
Creatinine, Ser: 1 mg/dL (ref 0.50–1.10)
GFR calc Af Amer: 53 mL/min — ABNORMAL LOW (ref >90)
GFR calc non Af Amer: 46 mL/min — ABNORMAL LOW (ref >90)

## 2011-12-03 MED ORDER — SODIUM CHLORIDE 0.9 % IJ SOLN
INTRAMUSCULAR | Status: AC
  Administered 2011-12-03: 3 mL
  Filled 2011-12-03: qty 3

## 2011-12-03 NOTE — Progress Notes (Signed)
Clinical Social Work:  Patient has a bed offer at [facility].  Patient will need a PT consult/Eval regarding patient baseline and skilled need for [employer] authorization.  Patient has Medicare but per business office, Francine Graven is patient's primary insurance.  Anticipating DC on 4/5 once all is completed and [facility] can verify authorization.  Will follow up and have requested order for PT.  MD aware of NH placement.    Ashley Jacobs, MSW LCSW [phone]

## 2011-12-03 NOTE — Progress Notes (Signed)
I spoke with Dr. Renard Matter around 9 pm last night, he was asking about pt status and any possible placement for pt. I informed him that SW and CM was involved but it didn't appear that the pt had a bed anywhere yet. I asked him if he wanted to do the TeleNeuro consult tonight, I was informed in report, but did not see an order. He said that he was told it couldn't be done, and was not necessary last night. I do think she needs to be seen by a neurologist in some way prior to d/c due to the change in her neuro status from her baseline, according to her family. I notified next shift RN who said she would address.  Sheryn Bison

## 2011-12-03 NOTE — Progress Notes (Signed)
12/03/11 1705 Patient noted to have small reddened,blanchable area to left buttocks with bath this afternoon. Area measured and cream applied as needed, discussed with patient's daughter-in-law at bedside. Stated okay. nursing to monitor. Discussed unable to have neuro consult at this time with dr Renard Matter, stated no need for teleneurology consult at this time. Also notified him of some swelling noted to hands and arms, concerned possibly d/t IVF rate received. Stated okay to saline lock IV since able to take in po at this time. Nursing to monitor.

## 2011-12-03 NOTE — Progress Notes (Signed)
12/03/11 1244 Patient alert and oriented to self this morning, communicating appropriately, able to feed self some at breakfast, required some assistance. Tolerated diet well with no complaints. Neuro consult pending, Dr Renard Matter stated teleneurology consult not required at this time per report. Nursing to monitor.

## 2011-12-03 NOTE — Progress Notes (Signed)
[name], [name]                  ACCOUNT NO.:  [id number]  MEDICAL RECORD NO.:  [id number]  LOCATION:  A313                          FACILITY:  [facility]  PHYSICIAN:  Earnestine Shipp G. Renard Matter, MD   DATE OF BIRTH:  Apr 14, 1913  DATE OF PROCEDURE: DATE OF DISCHARGE:                                PROGRESS NOTE   This patient is more alert today.  Apparently, she has been taking some food.  She was admitted with hypothermia.  Had a fall prior to admission.  She does have hypertension, coronary artery disease, osteoarthritis, and recent evidence of dehydration.  An MRI did show evidence of small vessel disease and global atrophy without hydrocephalus, paranasal sinus, and mucosal thickening.  OBJECTIVE:  VITAL SIGNS:  Blood pressure 115/63, respirations 16, pulse 79, temperature of 96.5. LUNGS:  Clear to P and A. HEART:  Regular rhythm. ABDOMEN:  No palpable organs or masses. NEUROLOGIC:  No apparent motor or sensory weakness in the extremities. Reflexes are equal.  ASSESSMENT:  The patient had a fall at home, was hypothermic on admission.  She is more alert.  PLAN:  To continue current regimen.  We were working on nursing home placement, which family desires to be accomplished.  She remains on warming blanket.     Georgine Wiltse G. Renard Matter, MD     AGM/MEDQ  D:  12/03/2011  T:  12/03/2011  Job:  [id number]

## 2011-12-03 NOTE — Plan of Care (Signed)
Problem: Phase I Progression Outcomes Goal: Pain controlled with appropriate interventions Outcome: Progressing Pt has appeared comfortable throughout the night, sleeping well.

## 2011-12-03 NOTE — Discharge Summary (Signed)
NAMEGILLIE, [name]                  ACCOUNT NO.:  [id number]  MEDICAL RECORD NO.:  [id number]  LOCATION:  A313                          FACILITY:  [facility]  PHYSICIAN:  Raylin Diguglielmo G. Steward Sames, MD   DATE OF BIRTH:  Jan 12, 1913  DATE OF ADMISSION:  11/30/2011 DATE OF DISCHARGE:  04/05/2013LH                              DISCHARGE SUMMARY   DIAGNOSIS:  Altered mental status, fall in elderly, hypothermia, small- vessel ischemic disease, dehydration, hypertension, coronary artery disease, osteoarthritis.  CONDITION:  Stable and improved at the time of discharge.  This 78 year old female was found on the floor at her home in the kitchen.  She was brought to [facility] Emergency Room for evaluation.  She lives alone.  She had been fairly healthy, but had required some surgery on left knee some 6 years ago, when she fell off a porch, has had chronic swelling in her left leg since then, had been on amlodipine for blood pressure, had seen primary physician approximately a year and a half prior to this admission.  She also has a history of emphysema, arthritis, and hypertension.  Apparently, sometimes used a walker at home.  Her speech was normal, but she was confused and noted to be hypothermic on admission.  PHYSICAL EXAMINATION:  VITAL SIGNS:  On admission, blood pressure 141/68, pulse 66, temp 92.2, respirations 20, O2 sat 100%, apparently the patient was warmed up to about 95 degrees on admission. HEENT:  Negative.  Membranes dry. HEART:  Regular rhythm. LUNGS:  Clear to P and A. ABDOMEN:  No palpable organs or masses.  No organomegaly. EXTREMITIES:  The patient had 1+ pitting edema of the distal left leg and ankle and foot, but not on the right side. NEUROLOGICAL:  Cranial nerves intact.  No motor or sensory abnormality noted.  Reflexes were normal.  LABORATORY DATA:  Admission CBC, WBC 4300, hemoglobin 11.5, 79% neutrophils, 14 lymphocytes.  CMP showed a BUN of 29,  creatinine 1.10. CK was 178, CK-MB 7.2.  Subsequent chemistries December 03, 2011, sodium 141, potassium 3.8, chloride 110, CO2 of 24, BUN 26, creatinine 1.0, calcium 8.7.  X-rays, MRI of head, prominent small-vessel disease type changes.  No acute infarct, restricted motion on right choroid plexus, thought to be incidental finding, scattered areas in brain, possible episodes of hemorrhagic ischemia, otherwise no intracranial hemorrhage. No intracranial mass.  Mild stenosis C2-C3 level.  Major intracranial vascular structures were patent.  HOSPITAL COURSE:  The patient was warmed with warming blankets throughout her entire hospitalization.  She was started on intravenous fluids which was continued throughout the entire period of hospitalization.  At times, she did get agitated and had to be given intravenous Ativan.  An MR of the brain was obtained, which showed no acute infarct, probable small-vessel type changes, global atrophy without hydrocephalus.  The patient at times was agitated, she gradually improved.  She was placed on liquids to begin with and then diet was added and increased.  She became more alert towards the latter part of the hospital stay.  It was obvious that she needed placement in nursing facility and the family was counseled of this and a  bed search started. She had low temperature throughout her hospital stay, and had to be on warming-type blankets for most of her hospital stay.  She was gradually able to get up some from the bed towards the latter part of hospital stay.  Physical Therapy consult was requested and it was felt she could be discharged after 4 days hospitalization and moved to [facility].  She was sent there on the following medications nasal O2 as needed, Ativan 0.5 mg q.4h p.r.n. for agitation.  Physical Therapy was requested.  It was felt she might benefit from Aricept and Namenda, but we will await prescribing this to see how she  does after few days at [facility].     Natosha Bou G. Renard Matter, MD     AGM/MEDQ  D:  12/03/2011  T:  12/03/2011  Job:  [id number]

## 2011-12-04 ENCOUNTER — Inpatient Hospital Stay
Admission: RE | Admit: 2011-12-04 | Discharge: 2014-06-26 | Disposition: A | Discharge status: Nursing Home (Includes ICF) | Payer: Medicare PPO | Source: Ambulatory Visit | Attending: Family Medicine | Admitting: Family Medicine

## 2011-12-04 DIAGNOSIS — R05 Cough: Secondary | ICD-10-CM

## 2011-12-04 DIAGNOSIS — R0902 Hypoxemia: Secondary | ICD-10-CM

## 2011-12-04 DIAGNOSIS — R1310 Dysphagia, unspecified: Principal | ICD-10-CM

## 2011-12-04 MED ORDER — LORAZEPAM 2 MG/ML IJ SOLN: 0.5000 mg | INTRAMUSCULAR | Status: AC | PRN

## 2011-12-04 NOTE — Progress Notes (Signed)
Reported called to Tsosie Billing, RN at the [facility]. Discharge instructions reviewed with RN and discharge paperwork and packet will be sent with patient to [facility]. Robin informed to continue to monitor patient for void.

## 2011-12-04 NOTE — Progress Notes (Signed)
Patient transported by tech to the [facility]. Discharge paperwork and packet sent with tech.

## 2011-12-04 NOTE — Progress Notes (Signed)
Called Dr. Renard Matter who stated to remove foley since patient is being discharged.

## 2011-12-04 NOTE — Progress Notes (Signed)
Patient not yet voided since foley removal. Bladder scan shows 91ml. Patient has not been drinking much. Will give patient fluids and encourage her to drink. Will continue to monitor.

## 2011-12-04 NOTE — Evaluation (Signed)
Physical Therapy Evaluation Patient Details Name: [name] MRN: [id number] DOB: [birth year] Today's Date: 12/04/2011  Problem List: There is no problem list on file for this patient.   Past Medical History:  Past Medical History  Diagnosis Date  . Hypertension   . Arthritis    Past Surgical History:  Past Surgical History  Procedure Date  . Knee surgery     left knee    PT Assessment/Plan/Recommendation PT Assessment Clinical Impression Statement: Pt is very pleasant and cooperative but extremely deconditioned following recent illness.  There is a overall weakness with a pronounced decrease in strength of L quadriceps which directly impedes gait.  She will definately benefit from SNF for generalized strengthening, increased mobility and ADL training.  She is anxious to get home where she understands that whe will need full time assist. PT Recommendation/Assessment: All further PT needs can be met in the next venue of care (Pt has been discharged) Barriers to Discharge: Decreased caregiver support PT Therapy Diagnosis : Difficulty walking;Abnormality of gait;Generalized weakness PT Recommendation Follow Up Recommendations: Skilled nursing facility Equipment Recommended: Defer to next venue PT Goals     PT Evaluation Precautions/Restrictions  Precautions Precautions: Fall Required Braces or Orthoses: No Restrictions Weight Bearing Restrictions: No Prior Functioning  Home Living Lives With: Alone Receives Help From: Personal care attendant (assist on M-W-F) Type of Home: House Home Layout: One level Home Access: Level entry Bathroom Shower/Tub: Engineer, manufacturing systems: Standard Bathroom Accessibility: Yes How Accessible: Accessible via walker Home Adaptive Equipment: Walker - rolling;Straight cane Additional Comments: has just recently started to use a walker at home Prior Function Level of Independence: Independent with gait;Independent with  transfers;Needs assistance with homemaking;Requires assistive device for independence;Independent with basic ADLs Driving: No Vocation: Retired Producer, television/film/video: Awake/alert Overall Cognitive Status: Appears within functional limits for tasks assessed Orientation Level: Oriented to person;Oriented to place Sensation/Coordination Sensation Light Touch: Appears Intact Stereognosis: Not tested Hot/Cold: Not tested Proprioception: Appears Intact Coordination Gross Motor Movements are Fluid and Coordinated: Yes Fine Motor Movements are Fluid and Coordinated: Not tested Extremity Assessment RUE Assessment RUE Assessment: Exceptions to [facility] RUE Strength RUE Overall Strength Comments: generally 3/5 LUE Assessment LUE Assessment: Exceptions to [facility] LUE Strength LUE Overall Strength Comments: generally 3-/5 RLE Assessment RLE Assessment: Exceptions to [facility] RLE Strength RLE Overall Strength Comments: generally 3/5 LLE Assessment LLE Assessment: Exceptions to [facility] LLE Strength LLE Overall Strength Comments: generally 3-/5 with quadriceps = 2+/5 Mobility (including Balance) Bed Mobility Bed Mobility: Yes Supine to Sit: HOB elevated (Comment degrees);4: Min assist (HOB at 30 deg) Supine to Sit Details (indicate cue type and reason): min assist to lift trunk up fully to sitting position Sit to Supine: 5: Supervision Sit to Supine - Details (indicate cue type and reason): very labored transfer with difficulty lifting LEs into bed Transfers Transfers: Yes Sit to Stand: 2: Max assist;From toilet;With upper extremity assist Sit to Stand Details (indicate cue type and reason): min assist from elevated surface, but from lower surface needs max assist due to LE weakness Stand to Sit: With upper extremity assist;2: Max assist;To toilet Stand to Sit Details: full control of descent to a lower surface, mod assist to bed Ambulation/Gait Ambulation/Gait: Yes Ambulation/Gait  Assistance: 4: Min assist Ambulation/Gait Assistance Details (indicate cue type and reason): pt has difficulty stabilizing on LLE due to quad weakness Ambulation Distance (Feet): 15 Feet Assistive device: Rolling walker Gait Pattern: Decreased stance time - left;Decreased step length - left;Step-through pattern;Trunk flexed;Shuffle  Gait velocity: slow, labored Stairs: No Wheelchair Mobility Wheelchair Mobility: No  Posture/Postural Control Posture/Postural Control: Postural limitations Postural Limitations: kyphotic posture Balance Balance Assessed: Yes Static Sitting Balance Static Sitting - Level of Assistance: 7: Independent Dynamic Sitting Balance Dynamic Sitting - Level of Assistance: 5: Stand by assistance Static Standing Balance Static Standing - Level of Assistance: 3: Mod assist Exercise    End of Session PT - End of Session Equipment Utilized During Treatment: Gait belt Activity Tolerance: Patient limited by fatigue Patient left: in bed;with call bell in reach;with bed alarm set;with family/visitor present Nurse Communication: Mobility status for transfers;Mobility status for ambulation General Behavior During Session: [facility] for tasks performed Cognition: [facility] for tasks performed  Konrad Penta 12/04/2011, 11:02 AM

## 2011-12-07 NOTE — Progress Notes (Signed)
Discharge summary sent to payer through [facility]  

## 2012-01-12 NOTE — Progress Notes (Signed)
[name], [name]                  ACCOUNT NO.:  [id number]  MEDICAL RECORD NO.:  [id number]  LOCATION:  S127                          FACILITY:  [facility]  PHYSICIAN:  Patrice Matthew G. Renard Matter, MD   DATE OF BIRTH:  19-Mar-1913  DATE OF PROCEDURE: DATE OF DISCHARGE:                                PROGRESS NOTE   This patient was admitted to the facility with altered mental status. She appears alert today complaining of her left leg pain.  Apparently, she had had a previous fracture in the left leg, has a plate in her leg. She has been wearing TED hose and has some edema above the left TED hose at lower part of the knee.  OBJECTIVE:  VITAL SIGNS:  Blood pressure 153/68, pulse 89, respirations 20, temp 98. LUNGS:  Clear to P and A. HEART:  Regular rhythm. ABDOMEN:  No palpable organs or masses. EXTREMITIES:  The left knee is slightly swollen with some edema in the left foot and leg.  The patient has an old scar, which is post surgical from previous trauma and apparent fracture of the left lower leg.  PLAN:  To continue her current regimen, but we will obtain Orthopedic consult with Dr. Romeo Apple and remove the TED hose.     [name] G. Renard Matter, MD     AGM/MEDQ  D:  01/12/2012  T:  01/12/2012  Job:  [id number]

## 2012-01-26 ENCOUNTER — Ambulatory Visit (INDEPENDENT_AMBULATORY_CARE_PROVIDER_SITE_OTHER): Payer: Medicare PPO | Admitting: Orthopedic Surgery

## 2012-01-26 ENCOUNTER — Inpatient Hospital Stay (INDEPENDENT_AMBULATORY_CARE_PROVIDER_SITE_OTHER): Payer: Medicare PPO

## 2012-01-26 ENCOUNTER — Encounter: Payer: Self-pay | Admitting: Orthopedic Surgery

## 2012-01-26 VITALS — BP 110/60 | Ht 63.0 in | Wt 97.0 lb

## 2012-01-26 DIAGNOSIS — S82143A Displaced bicondylar fracture of unspecified tibia, initial encounter for closed fracture: Secondary | ICD-10-CM | POA: Insufficient documentation

## 2012-01-26 DIAGNOSIS — S83412S Sprain of medial collateral ligament of left knee, sequela: Secondary | ICD-10-CM | POA: Insufficient documentation

## 2012-01-26 DIAGNOSIS — IMO0002 Reserved for concepts with insufficient information to code with codable children: Secondary | ICD-10-CM

## 2012-01-26 DIAGNOSIS — W19XXXA Unspecified fall, initial encounter: Secondary | ICD-10-CM

## 2012-01-26 DIAGNOSIS — M1710 Unilateral primary osteoarthritis, unspecified knee: Secondary | ICD-10-CM

## 2012-01-26 DIAGNOSIS — M25569 Pain in unspecified knee: Secondary | ICD-10-CM

## 2012-01-26 DIAGNOSIS — M1712 Unilateral primary osteoarthritis, left knee: Secondary | ICD-10-CM

## 2012-01-26 NOTE — Progress Notes (Signed)
  Subjective:    [name] is a 78 y.o. female status post open treatment internal fixation lateral plateau fracture in 2007. The patient was doing well until she fell in April of this year. She was not evaluated at that time. It is felt that she has not walked as well since that time. She was starting to have some increased difficulty with her ambulation and overall condition prior to her fall.  Evaluation today is to evaluate the left knee and left foot which appears to be turning out.  The patient complains of throbbing 5/10 intermittent pain in the morning and after exercise. Her pain is improved when she is resting it is worse when she is walking and he feels that the knee is swollen   The following portions of the patient's history were reviewed and updated as appropriate: allergies, current medications, past family history, past medical history, past social history, past surgical history and problem list.  Medical review of systems is negative except for the joint pain Review of Systems Pertinent items are noted in HPI.   Objective:    BP 122/72  Ht 4\' 11"  (1.499 m)  Wt 61.236 kg (135 lb)  BMI 27.27 kg/m2        Vital signs are stable as recorded  General appearance is normal  The patient is alert and oriented x3  The patient's mood and affect are normal  Gait assessment: She is ambulatory but it is difficult she is in valgus in terms of her left knee and her left foot is turning out in compensation for the knee deformity The cardiovascular exam reveals normal pulses and temperature without edema swelling.  The lymphatic system is negative for palpable lymph nodes  The sensory exam is normal.  There are no pathologic reflexes.  Balance is normal.   Exam of the left knee there is increased valgus alignment. There is tenderness on the medial side of the joint as well as the lateral side of the joint. Medially the tenderness is more at the insertion of the MCL on the  medial femoral condyle. The range of motion arc is approximately 90 with slight flexion contracture. The knee is stable with good spring of the medial collateral ligament. Strength is assessed as 5 over 5 extension power with intact skin and no wound issues.   X-ray left knee: Plate fixation from previous fracture has maintained its position. There is joint space narrowing laterally. No complicating features related to the plate are seen.    Assessment:    Left Mild medial collateral ligament sprain on the left Moderate osteoarthritis on the left    Plan:    Brace    Assessment:    Left Knee valgus osteoarthritis and tension on the MCL    Plan:    Based on the patient's age it is recommended that a brace at the placed on her left knee for support no other treatment at this time

## 2012-01-26 NOTE — Patient Instructions (Signed)
Brace for ambulation

## 2013-10-10 ENCOUNTER — Ambulatory Visit (HOSPITAL_COMMUNITY)
Admit: 2013-10-10 | Discharge: 2013-10-10 | Disposition: A | Discharge status: Home / Self Care | Payer: Medicare PPO | Attending: Family Medicine | Admitting: Family Medicine

## 2013-10-10 ENCOUNTER — Ambulatory Visit (HOSPITAL_COMMUNITY)
Admission: RE | Admit: 2013-10-10 | Discharge: 2013-10-10 | Disposition: A | Discharge status: Skilled Nursing Facility | Payer: Medicare PPO | Source: Skilled Nursing Facility | Attending: Family Medicine | Admitting: Family Medicine

## 2013-10-10 DIAGNOSIS — R1313 Dysphagia, pharyngeal phase: Secondary | ICD-10-CM | POA: Insufficient documentation

## 2013-10-10 DIAGNOSIS — I10 Essential (primary) hypertension: Secondary | ICD-10-CM | POA: Insufficient documentation

## 2013-10-10 DIAGNOSIS — R1310 Dysphagia, unspecified: Secondary | ICD-10-CM | POA: Insufficient documentation

## 2013-10-10 NOTE — Procedures (Signed)
Objective Swallowing Evaluation: Modified Barium Swallowing Study  Patient Details  Name: [name] MRN: [id number] Date of Birth: [birth year]  Today's Date: 10/10/2013 Time: 10:08 AM  - 10:37 AM    Past Medical History:  Past Medical History  Diagnosis Date  . Hypertension   . Arthritis    Past Surgical History:  Past Surgical History  Procedure Laterality Date  . Appendectomy    . Knee surgery      left knee, open treatment internal fixation lateral plateau fracture with plate and screw construct   HPI:  [name] is a 80 yo female resident at [facility] who was referred by Dr. Renard Matter for MBSS due to noted difficulty swallowing thin liquids. Liquids were recently changed to nectar-thick and pt has been tolerating well.   Symptoms/Limitations Symptoms: "Seems to have more difficulty with thin liquids." Special Tests: MBSS  Recommendation/Prognosis  Clinical Impression:   Dysphagia Diagnosis: Mild oral phase dysphagia;Mild pharyngeal phase dysphagia;Moderate pharyngeal phase dysphagia Clinical impression: [name] presents with mild oral phase and mild/mod pharyngeal phase dysphagia characterized by slow oral prep of solids, premature spillage of liquids, min delay in swallow initiation, min decreased tongue base retraction and pharyngeal pressure with decreased airway protection resulting in silent aspiration of thins before the swallow and variable silent penetration and aspiration during and after the swallow with residuals. Chin tuck was inconsistently effective in preventing aspiration with small sips thin. Pt with variable mild/mod vallecular and lateral channel residue which clears with repeat swallow (needs cues to clear as pt does not appear sensate). Recommend D3/mech soft with nectar-thick liquids (practice implementing the chin tuck- necessity with trials of thin), small sips, swallow 2x and clear throat intermittently. Pt does have a strong cued cough which helps  minimize aspiration risks, but she will need cues to implement. Results discussed with pt and treating SLP.   Swallow Evaluation Recommendations:  Diet Recommendations: Dysphagia 3 (Mechanical Soft);Nectar-thick liquid Liquid Administration via: Cup;Straw Medication Administration: Crushed with puree Supervision: Patient able to self feed;Intermittent supervision to cue for compensatory strategies Compensations: Slow rate;Small sips/bites;Multiple dry swallows after each bite/sip;Clear throat intermittently Postural Changes and/or Swallow Maneuvers: Out of bed for meals;Seated upright 90 degrees;Upright 30-60 min after meal Oral Care Recommendations: Oral care BID Other Recommendations: Order thickener from pharmacy;Clarify dietary restrictions;Remove water pitcher Follow up Recommendations: Skilled Nursing facility    Prognosis:      Individuals Consulted: Consulted and Agree with Results and Recommendations: Patient Report Sent to : Primary SLP;Facility (Comment);Referring physician      SLP Assessment/Plan Clinical Impression:   Dysphagia Diagnosis: Mild oral phase dysphagia;Mild pharyngeal phase dysphagia;Moderate pharyngeal phase dysphagia Clinical impression: [name] presents with mild oral phase and mild/mod pharyngeal phase dysphagia characterized by slow oral prep of solids, premature spillage of liquids, min delay in swallow initiation, min decreased tongue base retraction and pharyngeal pressure with decreased airway protection resulting in silent aspiration of thins before the swallow and variable silent penetration and aspiration during and after the swallow with residuals. Chin tuck was inconsistently effective in preventing aspiration with small sips thin. Pt with variable mild/mod vallecular and lateral channel residue which clears with repeat swallow (needs cues to clear as pt does not appear sensate). Recommend D3/mech soft with nectar-thick liquids (practice  implementing the chin tuck- necessity with trials of thin), small sips, swallow 2x and clear throat intermittently. Pt does have a strong cued cough which helps minimize aspiration risks, but she will need cues to implement.  Results discussed with pt and treating SLP.   Plan:          General: Date of Onset: 10/05/13 Type of Study: Modified Barium Swallowing Study Reason for Referral: Objectively evaluate swallowing function Previous Swallow Assessment: None on record Diet Prior to this Study: Dysphagia 3 (soft);Nectar-thick liquids Temperature Spikes Noted: No Respiratory Status: Room air History of Recent Intubation: No Behavior/Cognition: Alert;Cooperative;Pleasant mood Oral Cavity - Dentition: Adequate natural dentition Oral Motor / Sensory Function: Within functional limits Self-Feeding Abilities: Able to feed self Patient Positioning: Upright in chair Baseline Vocal Quality: Clear;Hoarse Volitional Cough: Strong Volitional Swallow: Able to elicit Anatomy: Within functional limits Pharyngeal Secretions: Not observed secondary MBS   Reason for Referral:   Objectively evaluate swallowing function    Oral Phase: Oral Preparation/Oral Phase Oral Phase: Impaired Oral - Solids Oral - Regular: Piecemeal swallowing;Delayed oral transit;Reduced posterior propulsion Oral Phase - Comment Oral Phase - Comment: Likely negatively impacted by decreased saliva production/dry mouth   Pharyngeal Phase:  Pharyngeal Phase Pharyngeal Phase: Impaired Pharyngeal - Nectar Pharyngeal - Nectar Cup: Delayed swallow initiation;Premature spillage to valleculae;Reduced tongue base retraction;Pharyngeal residue - valleculae;Lateral channel residue Pharyngeal - Nectar Straw: Delayed swallow initiation;Premature spillage to valleculae;Premature spillage to pyriform sinuses;Reduced tongue base retraction;Pharyngeal residue - valleculae;Lateral channel residue;Penetration/Aspiration during swallow;Reduced  airway/laryngeal closure Penetration/Aspiration details (nectar straw): Material does not enter airway;Material enters airway, remains ABOVE vocal cords then ejected out;Material enters airway, remains ABOVE vocal cords and not ejected out (removed with cued cough) Pharyngeal - Thin Pharyngeal - Thin Teaspoon: Delayed swallow initiation;Premature spillage to valleculae;Premature spillage to pyriform sinuses;Reduced airway/laryngeal closure;Trace aspiration Pharyngeal - Thin Cup: Delayed swallow initiation;Premature spillage to valleculae;Premature spillage to pyriform sinuses;Reduced airway/laryngeal closure;Moderate aspiration;Reduced tongue base retraction;Pharyngeal residue - valleculae;Penetration/Aspiration before swallow;Lateral channel residue;Penetration/Aspiration during swallow;Penetration/Aspiration after swallow;Trace aspiration;Compensatory strategies attempted (Comment) Penetration/Aspiration details (thin cup): Material enters airway, passes BELOW cords without attempt by patient to eject out (silent aspiration);Material enters airway, remains ABOVE vocal cords then ejected out;Material enters airway, CONTACTS cords and not ejected out;Material enters airway, CONTACTS cords then ejected out Pharyngeal - Solids Pharyngeal - Puree: Premature spillage to valleculae;Reduced pharyngeal peristalsis;Reduced tongue base retraction;Pharyngeal residue - valleculae;Pharyngeal residue - posterior pharnyx;Compensatory strategies attempted (Comment) (effortful swallow improved pressure) Pharyngeal - Regular: Delayed swallow initiation;Premature spillage to valleculae;Pharyngeal residue - valleculae;Reduced tongue base retraction Pharyngeal - Pill:  (penetration of nectars, pill ok)   Cervical Esophageal Phase  Cervical Esophageal Phase Cervical Esophageal Phase: WFL    G-code: Swallowing : initial: CJ, goal: CI, discharge: CJ  Thank you,  Havery MorosDabney Porter,  CCC-SLP [phone]   PORTER,DABNEY 10/10/2013, 12:49 PM                                                       Physician Treatment Plan  Your signature is required to indicate approval of the treatment plan/progress as stated above. Please make a copy of this report for your files and return this physician signed original in the self-addressed envelope or fax to [phone]. COMMENTS/CHANGES:__________________________________________________________________________________________________________________________   ____________________________________                      ____________________ Benjamin StainPHYSICIAN SIGNATURE  DATE

## 2014-06-25 ENCOUNTER — Inpatient Hospital Stay (HOSPITAL_COMMUNITY)
Admit: 2014-06-25 | Discharge: 2014-06-25 | Disposition: A | Discharge status: Home / Self Care | Payer: Medicare HMO | Attending: Family Medicine | Admitting: Family Medicine

## 2014-06-26 ENCOUNTER — Encounter (HOSPITAL_COMMUNITY): Payer: Self-pay | Admitting: Emergency Medicine

## 2014-06-26 ENCOUNTER — Emergency Department (HOSPITAL_COMMUNITY)
Admission: EM | Admit: 2014-06-26 | Discharge: 2014-06-26 | Disposition: A | Discharge status: Home / Self Care | Payer: Medicare PPO | Attending: Emergency Medicine | Admitting: Emergency Medicine

## 2014-06-26 ENCOUNTER — Inpatient Hospital Stay
Admission: RE | Admit: 2014-06-26 | Discharge: 2014-07-31 | Disposition: E | Payer: Medicare PPO | Source: Ambulatory Visit | Attending: Family Medicine | Admitting: Family Medicine

## 2014-06-26 DIAGNOSIS — J159 Unspecified bacterial pneumonia: Secondary | ICD-10-CM | POA: Insufficient documentation

## 2014-06-26 DIAGNOSIS — J189 Pneumonia, unspecified organism: Secondary | ICD-10-CM | POA: Diagnosis present

## 2014-06-26 DIAGNOSIS — Z8739 Personal history of other diseases of the musculoskeletal system and connective tissue: Secondary | ICD-10-CM | POA: Insufficient documentation

## 2014-06-26 DIAGNOSIS — R531 Weakness: Principal | ICD-10-CM

## 2014-06-26 DIAGNOSIS — I10 Essential (primary) hypertension: Secondary | ICD-10-CM | POA: Diagnosis not present

## 2014-06-26 DIAGNOSIS — Z79899 Other long term (current) drug therapy: Secondary | ICD-10-CM | POA: Diagnosis not present

## 2014-06-26 DIAGNOSIS — J690 Pneumonitis due to inhalation of food and vomit: Secondary | ICD-10-CM

## 2014-06-26 MED ORDER — DEXTROSE 5 % IV SOLN: 500.0000 mg | INTRAVENOUS | Status: DC

## 2014-06-26 MED ORDER — SODIUM CHLORIDE 0.9 % IV BOLUS (SEPSIS): 1000.0000 mL | Freq: Once | INTRAVENOUS | Status: DC

## 2014-06-26 MED ORDER — DEXTROSE 5 % IV SOLN: 1.0000 g | Freq: Once | INTRAVENOUS | Status: DC

## 2014-06-26 NOTE — ED Provider Notes (Signed)
CSN: [id number]     Arrival date & time Oct 10, 2013  1451 History  This chart was scribed for Raeford RazorStephen Camilo Mander, MD by Annye AsaAnna Dorsett, ED Scribe. This patient was seen in room [facility] and the patient's care was started at 3:11 PM.    Chief Complaint  Patient presents with  . Pneumonia   The history is provided by the patient. No language interpreter was used.    HPI Comments: [name] is a 80 y.o. female who presents to the Emergency Department complaining of pneumonia. She explains that she currently feels weak and tired; this is a recent development over the past two days. She denies coughing, nausea, vomiting, or any significant pain.   Patient is currently at [facility]; her O2 sat was 86 at some point this morning, at which time they gave her a breathing treatment. When her sats did not increase, they sent her to [facility] ED.   Past Medical History  Diagnosis Date  . Hypertension   . Arthritis    Past Surgical History  Procedure Laterality Date  . Appendectomy    . Knee surgery      left knee, open treatment internal fixation lateral plateau fracture with plate and screw construct   No family history on file. History  Substance Use Topics  . Smoking status: Never Smoker   . Smokeless tobacco: Never Used  . Alcohol Use: No   OB History   Grav Para Term Preterm Abortions TAB SAB Ect Mult Living                 Review of Systems  Respiratory: Negative for cough.   Gastrointestinal: Negative for nausea and vomiting.  Neurological: Positive for weakness.  All other systems reviewed and are negative.   Allergies  Peanut-containing drug products; Strawberry; and Aspirin  Home Medications   Prior to Admission medications   Medication Sig Start Date End Date Taking? Authorizing Provider  acetaminophen (ARTHRITIS PAIN RELIEF) 650 MG CR tablet Take 650 mg by mouth 2 (two) times daily.    Historical Provider, MD  acetaminophen (TYLENOL) 325 MG tablet Take 650 mg by mouth every  6 (six) hours as needed.    Historical Provider, MD  amLODipine (NORVASC) 5 MG tablet Take 5 mg by mouth daily.    Historical Provider, MD  cholecalciferol (VITAMIN D) 1000 UNITS tablet Take 1,000 Units by mouth daily.    Historical Provider, MD  promethazine (PHENERGAN) 12.5 MG suppository Place 12.5 mg rectally every 6 (six) hours as needed for nausea or vomiting.    Historical Provider, MD   BP 86/40  Pulse 83  Resp 20  SpO2 99% Physical Exam  Nursing note and vitals reviewed. Constitutional: She is oriented to person, place, and time. She appears well-developed and well-nourished.  Frail/cachetic; conclear appearing  HENT:  Head: Normocephalic and atraumatic.  Mouth/Throat: Oropharynx is clear and moist. No oropharyngeal exudate.  Eyes: EOM are normal. Pupils are equal, round, and reactive to light.  Cardiovascular: Normal rate, regular rhythm and normal heart sounds.  Exam reveals no gallop and no friction rub.   No murmur heard. Pulmonary/Chest: Effort normal and breath sounds normal. No respiratory distress. She has no wheezes. She has no rales.  Rhonchi in right base  Abdominal: Soft. There is no tenderness. There is no rebound and no guarding.  Mild suprapubic tenderness  Musculoskeletal: Normal range of motion. She exhibits no edema.  Neurological: She is alert and oriented to person, place, and time.  Droswy, weak voice, answers simple questions/follows simple commands  Skin: Skin is warm and dry. No rash noted.  Psychiatric: She has a normal mood and affect. Her behavior is normal.    ED Course  Procedures   DIAGNOSTIC STUDIES: Oxygen Saturation is 100% on RA, normal by my interpretation.    COORDINATION OF CARE: 3:15 PM Discussed treatment plan with pt at bedside and pt agreed to plan.   Labs Review Labs Reviewed - No data to display  Imaging Review Dg Chest 2 View  06/25/2014   CLINICAL DATA:  Cough and congestion. Patient unable to provide history.  Decreased oxygen saturation. History of hypertension.  EXAM: CHEST  2 VIEW  COMPARISON:  11/30/2011.  FINDINGS: There is consolidation in the lung bases, right greater than left. This is most suggestive of multifocal pneumonia. There may be a component of atelectasis.  No pulmonary edema. Lungs are hyperexpanded. There is no pneumothorax.  Cardiac silhouette is top-normal in size. Aorta is uncoiled. No mediastinal or hilar masses.  Bony thorax is demineralized but grossly intact.  IMPRESSION: 1. Right greater than left lung base opacity most consistent with multifocal pneumonia. No pulmonary edema.   Electronically Signed   By: Amie Portlandavid  Ormond M.D.   On: 06/25/2014 22:32     EKG Interpretation None      MDM   Final diagnoses:  CAP (community acquired pneumonia)   80 year old female with chest x-ray yesterday consistent with pneumonia. Apparently had low oxygen saturations at the [facility]. Oxygen saturations emergency room are normal. Family septic bedside. Patient is more or less at her baseline mental status. Patient is DO NOT RESUSCITATE. They're requesting no additional testing or treatment unless felt to be absolutely necessary. Patient was started on Levaquin yesterday. I do have some concerns for her mild hypotension. She does not appear toxic though. She can drink. She really needs to have fluids pushed. With her advanced age and generally frail condition has some concerns for dehydration. She additionally does have some mild suprapubic tenderness without any peritoneal signs. No reported vomiting or diarrhea. She may potentially have a urinary tract infection but Levaquin prescribed for her pneumonia should cover for common pathogens for this as well. I do not feel that it is absolutely necessary that she be admitted since she is being discharged back to a skilled facility. She needs to be closely watched and needs prompts reevaluation for any worsening symptoms or if they don't start to  improve within the next 24 hours. Family is comfortable with this. We'll respect their wishes.   I personally preformed the services scribed in my presence. The recorded information has been reviewed is accurate. Raeford RazorStephen Bartow Zylstra, MD.      Raeford RazorStephen Joline Encalada, MD 06/27/14 [id number]

## 2014-06-26 NOTE — ED Notes (Signed)
Per staff, pt had temp yesterday, did chest xray showed pneumonia, started Levaquin, today pt having troubling maintaining sat above 90%, breathing treatment given with no results.

## 2014-06-26 NOTE — ED Notes (Signed)
MD at the bedside  

## 2014-06-26 NOTE — Discharge Instructions (Signed)

## 2014-06-28 ENCOUNTER — Ambulatory Visit (HOSPITAL_COMMUNITY): Payer: Medicare HMO | Attending: Family Medicine

## 2014-06-28 DIAGNOSIS — J690 Pneumonitis due to inhalation of food and vomit: Secondary | ICD-10-CM | POA: Insufficient documentation

## 2014-07-01 DEATH — deceased

## 2014-07-31 DEATH — deceased

## 2016-08-30 IMAGING — CR DG CHEST 2V
2 series · 2 of 2 positions shown · non-contrast
Comparison: 11/30/2011.

CLINICAL DATA: Cough and congestion. Patient unable to provide
history. Decreased oxygen saturation. History of hypertension.

EXAM:
CHEST  2 VIEW

[view not recorded (1 of 2)]
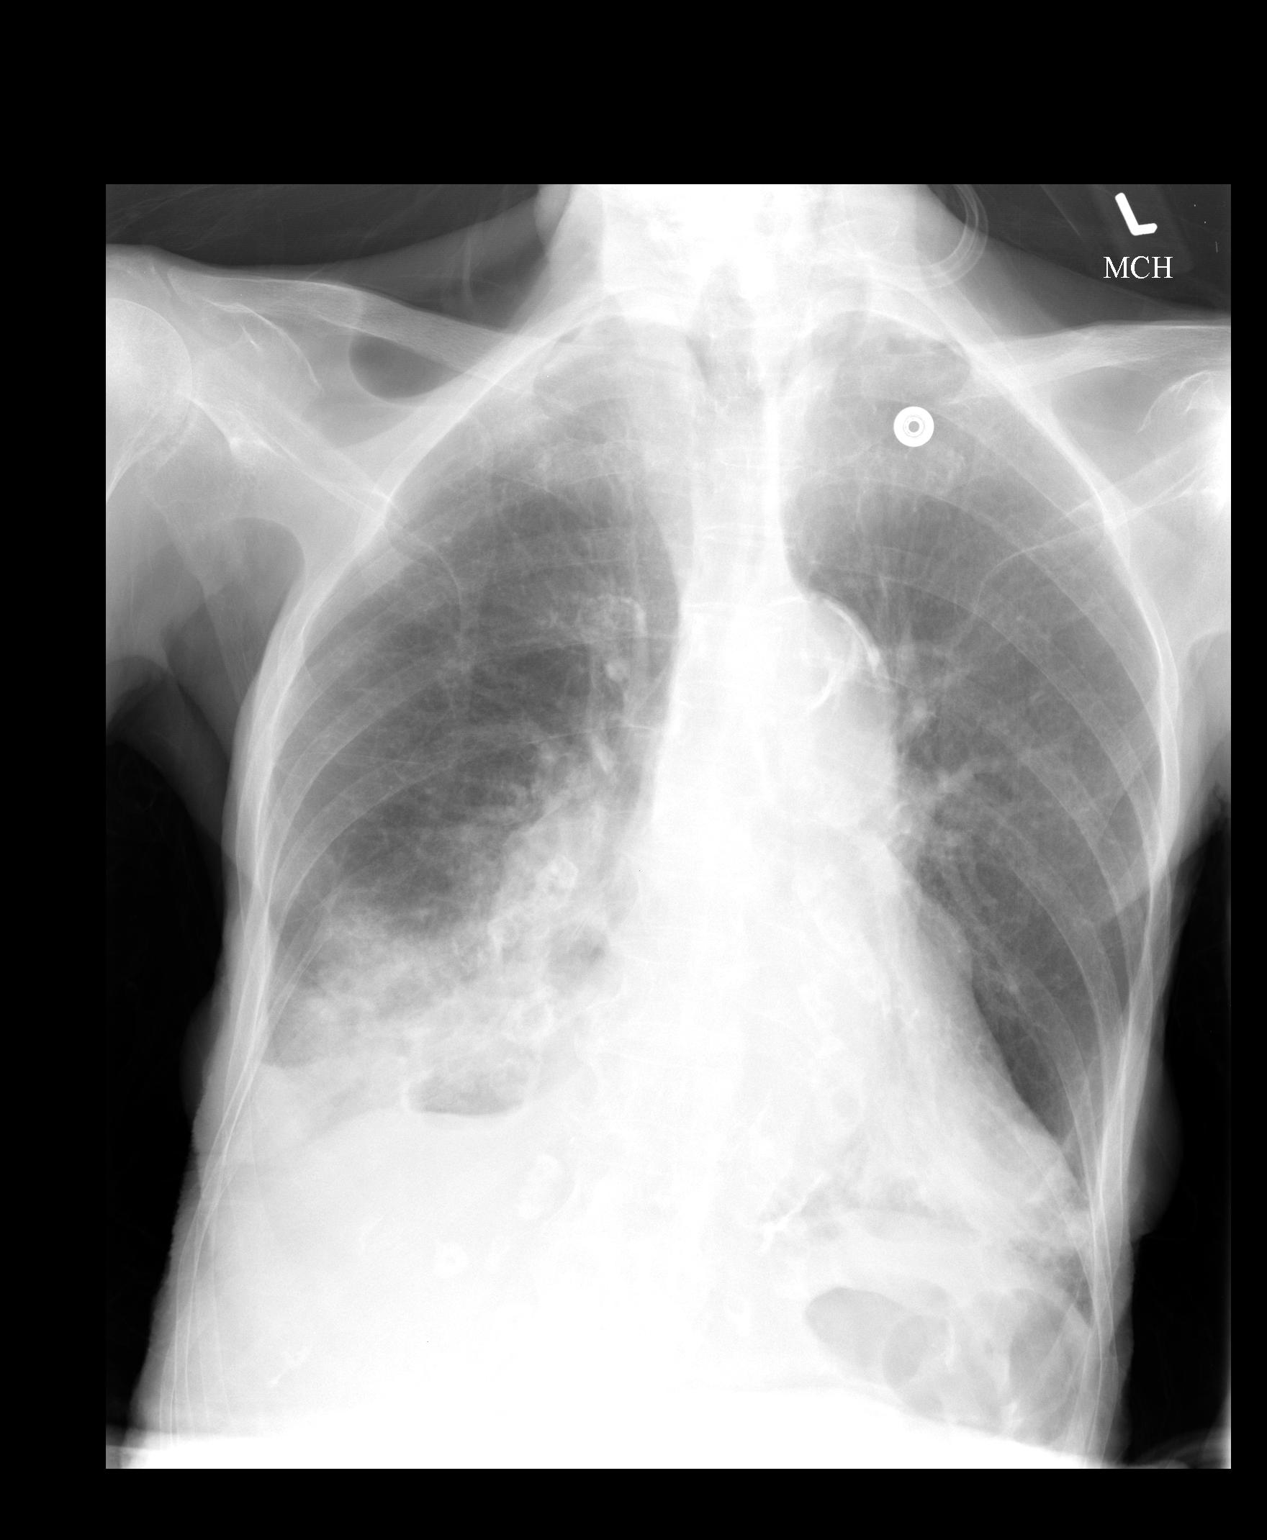

[view not recorded (2 of 2)]
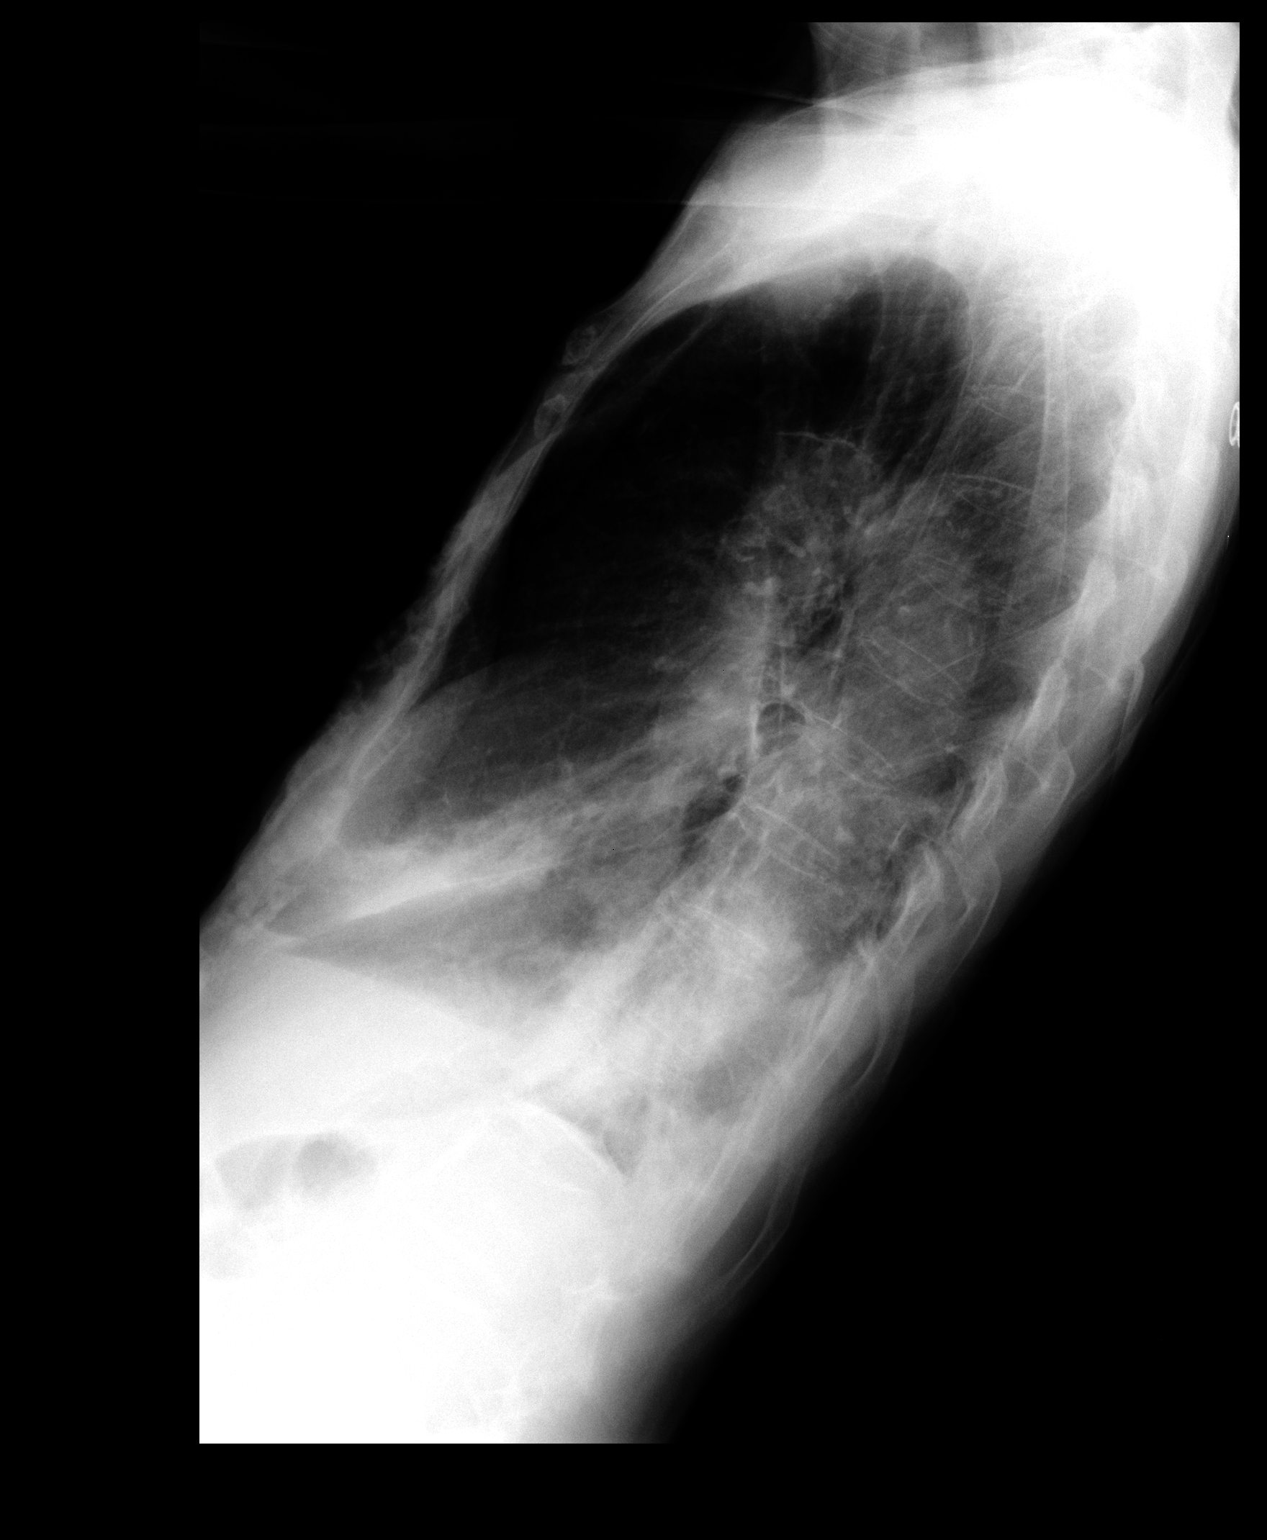

[2 of 2 positions shown; findings below may reference images not displayed]

FINDINGS: There is consolidation in the lung bases, right greater than left.
This is most suggestive of multifocal pneumonia. There may be a
component of atelectasis.

No pulmonary edema. Lungs are hyperexpanded. There is no
pneumothorax.

Cardiac silhouette is top-normal in size. Aorta is uncoiled. No
mediastinal or hilar masses.

Bony thorax is demineralized but grossly intact.
IMPRESSION: 1. Right greater than left lung base opacity most consistent with
multifocal pneumonia. No pulmonary edema.

## 2016-09-02 IMAGING — CR DG CHEST 1V
1 series · 1 of 1 positions shown · non-contrast
Comparison: 06/25/2014

CLINICAL DATA: Aspiration pneumonia

EXAM:
CHEST - 1 VIEW

[view not recorded]
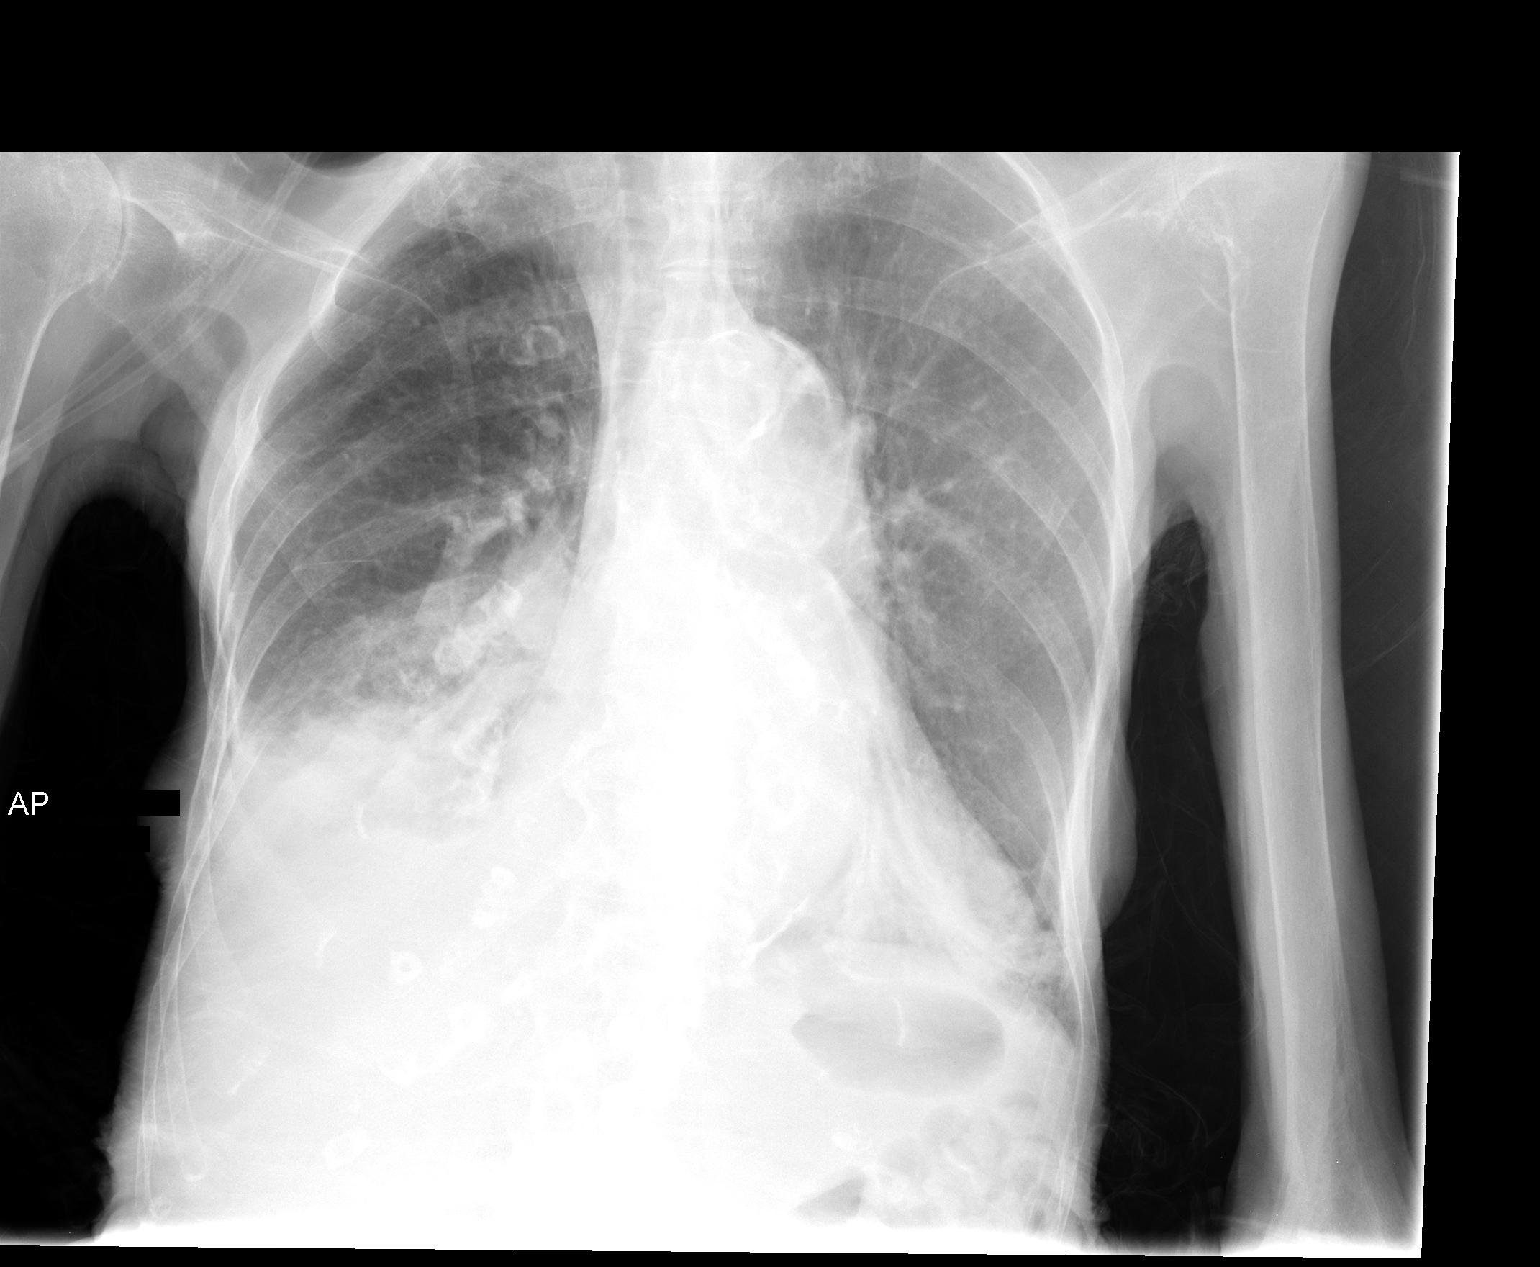

[1 of 1 positions shown; findings below may reference images not displayed]

FINDINGS: Right lower lobe airspace disease unchanged. Possible small right
effusion also noted.

Left lower lobe airspace disease also unchanged and less prominent
than on the right

Negative for heart failure.

Chronic rotator cuff tear bilaterally
IMPRESSION: Bibasilar infiltrates unchanged and most consistent with pneumonia.
Followup until clearing is recommended.
